# Patient Record
Sex: Male | Born: 1945 | Race: White | Hispanic: No
Health system: Midwestern US, Community
[De-identification: ages and names within clinical notes are randomized; demographics above are authoritative.]

## PROBLEM LIST (undated history)

## (undated) DIAGNOSIS — I10 Essential (primary) hypertension: Secondary | ICD-10-CM

## (undated) DIAGNOSIS — K859 Acute pancreatitis without necrosis or infection, unspecified: Secondary | ICD-10-CM

## (undated) DIAGNOSIS — I809 Phlebitis and thrombophlebitis of unspecified site: Secondary | ICD-10-CM

## (undated) DIAGNOSIS — G4700 Insomnia, unspecified: Secondary | ICD-10-CM

## (undated) HISTORY — PX: OTHER SURGICAL HISTORY: SHX169

## (undated) MED FILL — ELIQUIS 5MG TABS: 5 MG | 30 days supply | Qty: 60 | Fill #0 | Status: AC

## (undated) MED FILL — ELIQUIS 5MG TABS: 5 MG | 14 days supply | Qty: 28 | Fill #0 | Status: AC

---

## 2015-09-20 ENCOUNTER — Emergency Department (HOSPITAL_COMMUNITY)
Admission: EM | Admit: 2015-09-20 | Discharge: 2015-09-20 | Disposition: A | Discharge status: Home / Self Care | Payer: Medicare Other | Attending: Emergency Medicine | Admitting: Emergency Medicine

## 2015-09-20 ENCOUNTER — Emergency Department (HOSPITAL_COMMUNITY): Payer: Medicare Other

## 2015-09-20 ENCOUNTER — Encounter (HOSPITAL_COMMUNITY): Payer: Self-pay | Admitting: Emergency Medicine

## 2015-09-20 DIAGNOSIS — I10 Essential (primary) hypertension: Secondary | ICD-10-CM | POA: Insufficient documentation

## 2015-09-20 DIAGNOSIS — R109 Unspecified abdominal pain: Secondary | ICD-10-CM | POA: Diagnosis present

## 2015-09-20 DIAGNOSIS — G8929 Other chronic pain: Secondary | ICD-10-CM

## 2015-09-20 DIAGNOSIS — K5660 Unspecified intestinal obstruction: Secondary | ICD-10-CM | POA: Insufficient documentation

## 2015-09-20 DIAGNOSIS — R197 Diarrhea, unspecified: Secondary | ICD-10-CM | POA: Insufficient documentation

## 2015-09-20 DIAGNOSIS — K56609 Unspecified intestinal obstruction, unspecified as to partial versus complete obstruction: Secondary | ICD-10-CM

## 2015-09-20 HISTORY — DX: Acute pancreatitis without necrosis or infection, unspecified: K85.90

## 2015-09-20 HISTORY — DX: Essential (primary) hypertension: I10

## 2015-09-20 HISTORY — DX: Phlebitis and thrombophlebitis of unspecified site: I80.9

## 2015-09-20 HISTORY — DX: Insomnia, unspecified: G47.00

## 2015-09-20 LAB — COMPREHENSIVE METABOLIC PANEL
ALT: 19 U/L (ref 17–63)
AST: 25 U/L (ref 15–41)
Albumin: 3.8 g/dL (ref 3.5–5.0)
Alkaline Phosphatase: 45 U/L (ref 38–126)
Anion gap: 8 (ref 5–15)
BUN: 6 mg/dL (ref 6–20)
CALCIUM: 9.3 mg/dL (ref 8.9–10.3)
CHLORIDE: 104 mmol/L (ref 101–111)
CO2: 26 mmol/L (ref 22–32)
CREATININE: 0.96 mg/dL (ref 0.61–1.24)
Glucose, Bld: 149 mg/dL — ABNORMAL HIGH (ref 65–99)
Potassium: 4.2 mmol/L (ref 3.5–5.1)
Sodium: 138 mmol/L (ref 135–145)
TOTAL PROTEIN: 6.7 g/dL (ref 6.5–8.1)
Total Bilirubin: 0.7 mg/dL (ref 0.3–1.2)

## 2015-09-20 LAB — URINALYSIS, ROUTINE W REFLEX MICROSCOPIC
Bilirubin Urine: NEGATIVE
Glucose, UA: NEGATIVE mg/dL
Hgb urine dipstick: NEGATIVE
Ketones, ur: NEGATIVE mg/dL
LEUKOCYTES UA: NEGATIVE
NITRITE: NEGATIVE
PROTEIN: NEGATIVE mg/dL
Specific Gravity, Urine: 1.009 (ref 1.005–1.030)
pH: 6.5 (ref 5.0–8.0)

## 2015-09-20 LAB — CBC
HCT: 41.3 % (ref 39.0–52.0)
Hemoglobin: 13.8 g/dL (ref 13.0–17.0)
MCH: 29.2 pg (ref 26.0–34.0)
MCHC: 33.4 g/dL (ref 30.0–36.0)
MCV: 87.5 fL (ref 78.0–100.0)
PLATELETS: 157 10*3/uL (ref 150–400)
RBC: 4.72 MIL/uL (ref 4.22–5.81)
RDW: 14.5 % (ref 11.5–15.5)
WBC: 7.8 10*3/uL (ref 4.0–10.5)

## 2015-09-20 LAB — PROTIME-INR
INR: 2.93
PROTHROMBIN TIME: 31.2 s — AB (ref 11.4–15.2)

## 2015-09-20 LAB — LIPASE, BLOOD: LIPASE: 27 U/L (ref 11–51)

## 2015-09-20 MED ORDER — IOPAMIDOL (ISOVUE-300) INJECTION 61%
INTRAVENOUS | Status: AC
  Administered 2015-09-20: 100 mL
  Filled 2015-09-20: qty 100

## 2015-09-20 NOTE — ED Triage Notes (Signed)
Pt states "yesterday I had an xray done and it showed a blockage". Pt c/o diarrhea. Denies n/v. Denies any new pain. Last BM was today.

## 2015-09-20 NOTE — ED Provider Notes (Signed)
[facility]-EMERGENCY DEPT Provider Note   CSN: [id number] Arrival date & time: 09/20/15  1153     History   Chief Complaint Chief Complaint  Patient presents with  . Abdominal Pain    HPI Shawn Colon is a 70 y.o. male.  Patient is a 70 year old male with past medical history of chronic pancreatitis, phlebitis (currently on Coumadin) and hypertension who presents the ED with reported bout distraction. Patient reports he was seen by his primary care provider Shawn Colon(Mike Carroll, PA-C at [facility]) to follow-up regarding his recent change from next Coumadin last week and notes he had an x-ray performed. Patient states he was notified by the office this morning that his x-ray showed a bowel obstruction and was advised to come to the ED. Patient reports he has had nonbloody loose stool for the past week. He reports having chronic abdominal pain associated with his chronic pancreatitis but denies any recent changes. Denies fever, chills, cough, SOB, CP, N/V, urinary sxs, blood in urine or stool. Denies any recent use of antibiotics, recent hospitalist, recent travel outside the [place] or drinking from any fresh water sources. Patient reports he was feeling fine and states he would not come to the ED EMS he was advised to by his PCP. Denies history of abdominal surgeries.      Past Medical History:  Diagnosis Date  . Hypertension   . Insomnia   . Pancreatitis   . Phlebitis     There are no active problems to display for this patient.   Past Surgical History:  Procedure Laterality Date  . left heel surgery         Home Medications    Prior to Admission medications   Medication Sig Start Date End Date Taking? Authorizing Provider  clopidogrel (PLAVIX) 75 MG tablet Take 75 mg by mouth daily.   Yes Historical Provider, MD  DOXEPIN HCL PO Take by mouth.   Yes Historical Provider, MD  gabapentin (NEURONTIN) 300 MG capsule Take 300 mg by mouth 3 (three) times daily.   Yes Historical  Provider, MD  gabapentin (NEURONTIN) 600 MG tablet Take 600 mg by mouth 3 (three) times daily.   Yes Historical Provider, MD  levothyroxine (SYNTHROID, LEVOTHROID) 75 MCG tablet Take 75 mcg by mouth daily before breakfast.   Yes Historical Provider, MD  lisinopril (PRINIVIL,ZESTRIL) 10 MG tablet Take 10 mg by mouth daily.   Yes Historical Provider, MD  Zolpidem Tartrate (AMBIEN PO) Take by mouth.   Yes Historical Provider, MD    Family History No family history on file.  Social History Social History  Substance Use Topics  . Smoking status: Never Smoker  . Smokeless tobacco: Not on file  . Alcohol use No     Allergies   Flagyl [metronidazole]; Levaquin [levofloxacin in d5w]; Morphine and related; and Septra [sulfamethoxazole-trimethoprim]   Review of Systems Review of Systems  Gastrointestinal: Positive for abdominal pain (chronic, unchanged) and diarrhea (nonbloody, loose).  All other systems reviewed and are negative.    Physical Exam Updated Vital Signs BP 148/70   Pulse 73   Temp 97.5 F (36.4 C) (Oral)   Resp 13   SpO2 98%   Physical Exam  Constitutional: He is oriented to person, place, and time. He appears well-developed and well-nourished.  HENT:  Head: Normocephalic and atraumatic.  Mouth/Throat: Oropharynx is clear and moist. No oropharyngeal exudate.  Eyes: Conjunctivae and EOM are normal. Pupils are equal, round, and reactive to light. Right eye exhibits no  discharge. Left eye exhibits no discharge. No scleral icterus.  Neck: Normal range of motion. Neck supple.  Cardiovascular: Normal rate, regular rhythm, normal heart sounds and intact distal pulses.   Pulmonary/Chest: Effort normal and breath sounds normal. No respiratory distress. He has no wheezes. He has no rales. He exhibits no tenderness.  Abdominal: Soft. Bowel sounds are normal. He exhibits no distension and no mass. There is no tenderness. There is no rebound and no guarding. No hernia.    Musculoskeletal: Normal range of motion. He exhibits no edema.  Neurological: He is alert and oriented to person, place, and time.  Skin: Skin is warm and dry.  Nursing note and vitals reviewed.    ED Treatments / Results  Labs (all labs ordered are listed, but only abnormal results are displayed) Labs Reviewed  COMPREHENSIVE METABOLIC PANEL - Abnormal; Notable for the following:       Result Value   Glucose, Bld 149 (*)    All other components within normal limits  LIPASE, BLOOD  CBC  URINALYSIS, ROUTINE W REFLEX MICROSCOPIC (NOT AT [facility])  PROTIME-INR    EKG  EKG Interpretation None       Radiology No results found.  Procedures Procedures (including critical care time)  Medications Ordered in ED Medications - No data to display   Initial Impression / Assessment and Plan / ED Course  I have reviewed the triage vital signs and the nursing notes.  Pertinent labs & imaging results that were available during my care of the patient were reviewed by me and considered in my medical decision making (see chart for details).  Clinical Course    Pt presents for possible SBO. Pt was seen by PCP yesterday for follow up regarding recent change from plavix to coumadin and acute nonbloody diarrhea and was notified that his abdominal xray that was performed yesterday showing possible SBO. Hx of phlebitis, chronic pancreatitis. VSS. Exam unremarkable, no abdominal tenderness. INR 2.9. Labs unremarkable. CT abdomen ordered.  Hand-off to Dr. Cristi Loron. Pending CT abdomen. Dispo pending imaging.   Final Clinical Impressions(s) / ED Diagnoses   Final diagnoses:  Bowel obstruction [facility])    New Prescriptions New Prescriptions   No medications on file     Barrett Henle, [place] 09/20/15 1602    Charlynne Pander, MD 09/23/15 1139

## 2015-09-20 NOTE — ED Notes (Signed)
Pt attempted to urinate to collect sample. Attempt unsuccessful.

## 2017-12-20 ENCOUNTER — Emergency Department: Payer: Medicare Other

## 2017-12-20 ENCOUNTER — Emergency Department
Admission: EM | Admit: 2017-12-20 | Discharge: 2017-12-20 | Disposition: A | Discharge status: Home / Self Care | Payer: Medicare Other | Attending: Emergency Medicine | Admitting: Emergency Medicine

## 2017-12-20 ENCOUNTER — Other Ambulatory Visit: Payer: Self-pay

## 2017-12-20 ENCOUNTER — Encounter: Payer: Self-pay | Admitting: Emergency Medicine

## 2017-12-20 DIAGNOSIS — S298XXA Other specified injuries of thorax, initial encounter: Secondary | ICD-10-CM | POA: Diagnosis not present

## 2017-12-20 DIAGNOSIS — Y929 Unspecified place or not applicable: Secondary | ICD-10-CM | POA: Insufficient documentation

## 2017-12-20 DIAGNOSIS — Z79899 Other long term (current) drug therapy: Secondary | ICD-10-CM | POA: Diagnosis not present

## 2017-12-20 DIAGNOSIS — S4981XA Other specified injuries of right shoulder and upper arm, initial encounter: Secondary | ICD-10-CM | POA: Diagnosis present

## 2017-12-20 DIAGNOSIS — Y999 Unspecified external cause status: Secondary | ICD-10-CM | POA: Diagnosis not present

## 2017-12-20 DIAGNOSIS — Y9389 Activity, other specified: Secondary | ICD-10-CM | POA: Diagnosis not present

## 2017-12-20 DIAGNOSIS — I10 Essential (primary) hypertension: Secondary | ICD-10-CM | POA: Insufficient documentation

## 2017-12-20 DIAGNOSIS — M25511 Pain in right shoulder: Secondary | ICD-10-CM

## 2017-12-20 DIAGNOSIS — W1812XA Fall from or off toilet with subsequent striking against object, initial encounter: Secondary | ICD-10-CM | POA: Diagnosis not present

## 2017-12-20 DIAGNOSIS — W19XXXA Unspecified fall, initial encounter: Secondary | ICD-10-CM

## 2017-12-20 DIAGNOSIS — S299XXA Unspecified injury of thorax, initial encounter: Secondary | ICD-10-CM

## 2017-12-20 MED ORDER — KETOROLAC TROMETHAMINE 30 MG/ML IJ SOLN
15.0000 mg | Freq: Once | INTRAMUSCULAR | Status: AC
  Administered 2017-12-20: 15 mg via INTRAMUSCULAR
  Filled 2017-12-20: qty 1

## 2017-12-20 NOTE — ED Provider Notes (Signed)
[facility] Emergency Department Provider Note   ____________________________________________    I have reviewed the triage vital signs and the nursing notes.   HISTORY  Chief Complaint Shoulder Pain and Rib Injury     HPI Shawn Colon is a 72 y.o. male who presents after a fall.  He complains of right-sided shoulder pain as well as right-sided chest wall pain.  Patient reports he leaned forward too far while sitting on the toilet and fell forward into a wall.  Denies LOC.  No significant head injury.  No back pain or neck pain.  Complains of right-sided chest pain which is mild to moderate but only with movement.  Also moderate right shoulder pain although he is able to range it but it is uncomfortable.  Has taken an oxycodone which has helped.   Past Medical History:  Diagnosis Date  . Hypertension   . Insomnia   . Pancreatitis   . Phlebitis     There are no active problems to display for this patient.   Past Surgical History:  Procedure Laterality Date  . left heel surgery      Prior to Admission medications   Medication Sig Start Date End Date Taking? Authorizing Provider  DOXEPIN HCL PO Take by mouth.    [provider]  ferrous sulfate 325 (65 FE) MG tablet Take 325 mg by mouth daily with breakfast.    [provider]  gabapentin (NEURONTIN) 600 MG tablet Take 600 mg by mouth 3 (three) times daily.    [provider]  levothyroxine (SYNTHROID, LEVOTHROID) 75 MCG tablet Take 75 mcg by mouth daily before breakfast.    [provider]  lisinopril (PRINIVIL,ZESTRIL) 10 MG tablet Take 10 mg by mouth daily.    [provider]  oxyCODONE-acetaminophen (PERCOCET) 7.5-325 MG tablet Take 1 tablet by mouth every 4 (four) hours as needed for severe pain.    [provider]  Potassium 75 MG TABS Take 75 mg by mouth daily.    [provider]  QUEtiapine (SEROQUEL) 100 MG tablet Take 100  mg by mouth at bedtime.    [provider]  warfarin (COUMADIN) 4 MG tablet Take 4 mg by mouth daily.    [provider]  Zolpidem Tartrate (AMBIEN PO) Take 20 mg by mouth at bedtime.     [provider]     Allergies Flagyl [metronidazole]; Levaquin [levofloxacin in d5w]; Morphine and related; and Septra [sulfamethoxazole-trimethoprim]  No family history on file.  Social History Social History   Tobacco Use  . Smoking status: Never Smoker  Substance Use Topics  . Alcohol use: No  . Drug use: Not on file    Review of Systems  Constitutional: No dizziness Eyes: No visual changes.  ENT: No neck pain Cardiovascular: As above Respiratory: Denies shortness of breath. Gastrointestinal: No abdominal pain.  No nausea, no vomiting.   Genitourinary: Negative for groin injury Musculoskeletal: As above Skin: Negative for laceration Neurological: Negative for headaches or weakness   ____________________________________________   PHYSICAL EXAM:  VITAL SIGNS: ED Triage Vitals  Enc Vitals Group     BP 12/20/17 1330 (!) 122/52     Pulse Rate 12/20/17 1330 76     Resp 12/20/17 1330 16     Temp 12/20/17 1330 98.2 F (36.8 C)     Temp Source 12/20/17 1330 Oral     SpO2 12/20/17 1330 91 %     Weight 12/20/17 1331 83 kg (  183 lb)     Height 12/20/17 1331 1.803 m (5\' 11" )     Head Circumference --      Peak Flow --      Pain Score 12/20/17 1337 8     Pain Loc --      Pain Edu? --      Excl. in GC? --     Constitutional: Alert and oriented. No acute distress.  Eyes: Conjunctivae are normal.  Head: Atraumatic. Nose: No swelling or epistaxis. Mouth/Throat: Mucous membranes are moist.   Neck:  Painless ROM, no vertebral tenderness palpation Cardiovascular: Normal rate, regular rhythm. Grossly normal heart sounds.  Good peripheral circulation.  Mild tenderness palpation along the right anterior chest wall at approximately T4-T5 level Respiratory: Normal  respiratory effort.  No retractions. Lungs CTAB. Gastrointestinal: Soft and nontender. No distention.  No CVA tenderness.  Musculoskeletal: Right shoulder: No significant swelling or erythema, range of motion is uncomfortable but he is able to do it.  No bony ab normalities.  Clavicle is normal.  Warm and well perfused extremity Neurologic:  Normal speech and language. No gross focal neurologic deficits are appreciated.  Skin:  Skin is warm, dry and intact. No rash noted. Psychiatric: Mood and affect are normal. Speech and behavior are normal.  ____________________________________________   LABS (all labs ordered are listed, but only abnormal results are displayed)  Labs Reviewed - No data to display ____________________________________________  EKG   ____________________________________________  RADIOLOGY  Rib x-ray negative, right shoulder x-ray negative ____________________________________________   PROCEDURES  Procedure(s) performed: No  Procedures   Critical Care performed: No ____________________________________________   INITIAL IMPRESSION / ASSESSMENT AND PLAN / ED COURSE  Pertinent labs & imaging results that were available during my care of the patient were reviewed by me and considered in my medical decision making (see chart for details).  Patient presents after a fall with pain as described above, x-rays negative for bony injury.  Discussed with patient possible rib fracture and recommended CT without contrast however the patient declined.  Discussed with patient the risk for pneumonia. recommend supportive care, outpatient follow-up, return precautions discussed.     ____________________________________________   FINAL CLINICAL IMPRESSION(S) / ED DIAGNOSES  Final diagnoses:  Fall, initial encounter  Acute pain of right shoulder  Chest wall injury, initial encounter        Note:  This document was prepared using Dragon voice recognition  software and may include unintentional dictation errors.    Jene Every, MD 12/20/17 [id number]

## 2017-12-20 NOTE — ED Notes (Signed)
FIRST NURSE NOTE: pt comes into the ED WR via EMS from home, states he lost his balance yesterday and fell hitting his head, denies LOC. Pt is on coumadin. Pt c/o right shoulder pain and lower back pain. Pt is a/ox4 on arrival, able to stand and pivet. Wife is sitting with the pt in the WR.

## 2017-12-20 NOTE — ED Triage Notes (Signed)
States lost balance yesterday and fell. Pain R shoulder and R chest wall. No resp distress.

## 2018-01-18 ENCOUNTER — Encounter: Payer: Medicare Other | Attending: Physician Assistant | Admitting: Physician Assistant

## 2018-01-18 DIAGNOSIS — Z7901 Long term (current) use of anticoagulants: Secondary | ICD-10-CM | POA: Diagnosis not present

## 2018-01-18 DIAGNOSIS — E1151 Type 2 diabetes mellitus with diabetic peripheral angiopathy without gangrene: Secondary | ICD-10-CM | POA: Diagnosis not present

## 2018-01-18 DIAGNOSIS — Z881 Allergy status to other antibiotic agents status: Secondary | ICD-10-CM | POA: Insufficient documentation

## 2018-01-18 DIAGNOSIS — Z888 Allergy status to other drugs, medicaments and biological substances status: Secondary | ICD-10-CM | POA: Diagnosis not present

## 2018-01-18 DIAGNOSIS — Z87891 Personal history of nicotine dependence: Secondary | ICD-10-CM | POA: Diagnosis not present

## 2018-01-18 DIAGNOSIS — E1140 Type 2 diabetes mellitus with diabetic neuropathy, unspecified: Secondary | ICD-10-CM | POA: Insufficient documentation

## 2018-01-18 DIAGNOSIS — Z882 Allergy status to sulfonamides status: Secondary | ICD-10-CM | POA: Insufficient documentation

## 2018-01-18 DIAGNOSIS — Z885 Allergy status to narcotic agent status: Secondary | ICD-10-CM | POA: Insufficient documentation

## 2018-01-18 DIAGNOSIS — G9009 Other idiopathic peripheral autonomic neuropathy: Secondary | ICD-10-CM | POA: Insufficient documentation

## 2018-01-18 DIAGNOSIS — Z809 Family history of malignant neoplasm, unspecified: Secondary | ICD-10-CM | POA: Insufficient documentation

## 2018-01-18 DIAGNOSIS — L89613 Pressure ulcer of right heel, stage 3: Secondary | ICD-10-CM | POA: Insufficient documentation

## 2018-01-18 DIAGNOSIS — E11622 Type 2 diabetes mellitus with other skin ulcer: Secondary | ICD-10-CM | POA: Diagnosis not present

## 2018-01-18 DIAGNOSIS — I10 Essential (primary) hypertension: Secondary | ICD-10-CM | POA: Diagnosis not present

## 2018-01-21 NOTE — Progress Notes (Signed)
Shawn, Colon ([id number]) Visit Report for 01/18/2018 Abuse/Suicide Risk Screen Details Patient Name: Shawn Colon, Shawn Colon Date of Service: 01/18/2018 1:15 PM Medical Record Number: [id number] Patient Account Number: [id number] Date of Birth/Sex: June 22, 1945 (73 y.o. Male) Treating RN: Arnette Norris Primary Care Gryphon Vanderveen: Wynn Banker Other Clinician: Referring Miraya Cudney: Wynn Banker Treating Mickeal Daws/Extender: STONE III, HOYT Weeks in Treatment: 0 Abuse/Suicide Risk Screen Items Answer ABUSE/SUICIDE RISK SCREEN: Has anyone close to you tried to hurt or harm you recentlyo No Do you feel uncomfortable with anyone in your familyo No Has anyone forced you do things that you didnot want to doo No Do you have any thoughts of harming yourselfo No Patient displays signs or symptoms of abuse and/or neglect. No Electronic Signature(s) Signed: 01/20/2018 2:41:13 PM By: Arnette Norris Entered By: Arnette Norris on 01/18/2018 13:55:38 Shawn Colon ([id number]) -------------------------------------------------------------------------------- Activities of Daily Living Details Patient Name: Shawn, Colon Date of Service: 01/18/2018 1:15 PM Medical Record Number: [id number] Patient Account Number: [id number] Date of Birth/Sex: 1946/01/03 (73 y.o. Male) Treating RN: Arnette Norris Primary Care Neils Sirac[place]: Wynn Banker Other Clinician: Referring Khadeja Abt: Wynn Banker Treating Wilbur [place]/Extender: STONE III, HOYT Weeks in Treatment: 0 Activities of Daily Living Items Answer Activities of Daily Living (Please select one for each item) Drive Automobile Not Able Take Medications Completely Able Use Telephone Completely Able Care for Appearance Completely Able Use Toilet Completely Able Bath / Shower Completely Able Dress Self Completely Able Feed Self Completely Able Walk Completely Able Get In / Out Bed Completely Able Housework Completely Able Prepare Meals Completely Able Handle Money  Completely Able Shop for Self Completely Able Electronic Signature(s) Signed: 01/20/2018 2:41:13 PM By: Arnette Norris Entered By: Arnette Norris on 01/18/2018 13:55:59 Shawn Colon ([id number]) -------------------------------------------------------------------------------- Education Assessment Details Patient Name: Shawn Colon Date of Service: 01/18/2018 1:15 PM Medical Record Number: [id number] Patient Account Number: [id number] Date of Birth/Sex: 1945/07/10 (73 y.o. Male) Treating RN: Arnette Norris Primary Care Toniya Rozar: Wynn Banker Other Clinician: Referring Mohammed Mcandrew: Wynn Banker Treating Camdyn Beske/Extender: Linwood Dibbles, HOYT Weeks in Treatment: 0 Primary Learner Assessed: Patient Learning Preferences/Education Level/Primary Language Learning Preference: Explanation Highest Education Level: College or Above Preferred Language: English Cognitive Barrier Assessment/Beliefs Language Barrier: No Translator Needed: No Memory Deficit: No Emotional Barrier: No Physical Barrier Assessment Impaired Vision: No Impaired Hearing: No Decreased Hand dexterity: No Knowledge/Comprehension Assessment Knowledge Level: High Comprehension Level: High Ability to understand written High instructions: Ability to understand verbal High instructions: Motivation Assessment Anxiety Level: Calm Cooperation: Cooperative Education Importance: Acknowledges Need Interest in Health Problems: Asks Questions Perception: Coherent Willingness to Engage in Self- High Management Activities: Readiness to Engage in Self- High Management Activities: Electronic Signature(s) Signed: 01/20/2018 2:41:13 PM By: Arnette Norris Entered By: Arnette Norris on 01/18/2018 13:56:35 Shawn Colon ([id number]) -------------------------------------------------------------------------------- Fall Risk Assessment Details Patient Name: Shawn Colon Date of Service: 01/18/2018 1:15 PM Medical Record  Number: [id number] Patient Account Number: [id number] Date of Birth/Sex: [birth year] (73 y.o. Male) Treating RN: Arnette Norris Primary Care Amiah Frohlich: Wynn Banker Other Clinician: Referring Beola Vasallo: Wynn Banker Treating Renan Danese/Extender: STONE III, HOYT Weeks in Treatment: 0 Fall Risk Assessment Items Have you had 2 or more falls in the last 12 monthso 0 No Have you had any fall that resulted in injury in the last 12 monthso 0 No FALL RISK ASSESSMENT: History of falling - immediate or within 3 months 25 Yes Secondary diagnosis 0 No Ambulatory aid None/bed rest/wheelchair/nurse 0 No Crutches/cane/walker 0 No Furniture 0 No IV Access/Saline Lock 0 No Gait/Training Normal/bed rest/immobile 0 No Weak 0 No  Impaired 0 No Mental Status Oriented to own ability 0 Yes Electronic Signature(s) Signed: 01/20/2018 2:41:13 PM By: Arnette Norris Entered By: Arnette Norris on 01/18/2018 13:57:37 Shawn Colon ([id number]) -------------------------------------------------------------------------------- Foot Assessment Details Patient Name: Shawn Colon Date of Service: 01/18/2018 1:15 PM Medical Record Number: [id number] Patient Account Number: [id number] Date of Birth/Sex: [birth year] (73 y.o. Male) Treating RN: Arnette Norris Primary Care Trevontae Lindahl: Wynn Banker Other Clinician: Referring Hamzah Savoca: Wynn Banker Treating Natalye Kott/Extender: STONE III, HOYT Weeks in Treatment: 0 Foot Assessment Items Site Locations + = Sensation present, - = Sensation absent, C = Callus, U = Ulcer R = Redness, W = Warmth, M = Maceration, PU = Pre-ulcerative lesion F = Fissure, S = Swelling, D = Dryness Assessment Right: Left: Other Deformity: No No Prior Foot Ulcer: No No Prior Amputation: No No Charcot Joint: No No Ambulatory Status: Ambulatory Without Help Gait: Unsteady Electronic Signature(s) Signed: 01/20/2018 2:41:13 PM By: Arnette Norris Entered By: Arnette Norris on 01/18/2018  13:59:58 Shawn Colon ([id number]) -------------------------------------------------------------------------------- Nutrition Risk Assessment Details Patient Name: Shawn Colon Date of Service: 01/18/2018 1:15 PM Medical Record Number: [id number] Patient Account Number: [id number] Date of Birth/Sex: [birth year] (73 y.o. Male) Treating RN: Arnette Norris Primary Care Clela Hagadorn: Wynn Banker Other Clinician: Referring Boots Mcglown: Wynn Banker Treating Javier Mamone/Extender: STONE III, HOYT Weeks in Treatment: 0 Height (in): 70 Weight (lbs): 156 Body Mass Index (BMI): 22.4 Nutrition Risk Assessment Items NUTRITION RISK SCREEN: I have an illness or condition that made me change the kind and/or amount of 0 No food I eat I eat fewer than two meals per day 0 No I eat few fruits and vegetables, or milk products 0 No I have three or more drinks of beer, liquor or wine almost every day 0 No I have tooth or mouth problems that make it hard for me to eat 0 No I don't always have enough money to buy the food I need 0 No I eat alone most of the time 0 No I take three or more different prescribed or over-the-counter drugs a day 1 Yes Without wanting to, I have lost or gained 10 pounds in the last six months 0 No I am not always physically able to shop, cook and/or feed myself 0 No Nutrition Protocols Good Risk Protocol Moderate Risk Protocol Electronic Signature(s) Signed: 01/20/2018 2:41:13 PM By: Arnette Norris Entered By: Arnette Norris on 01/18/2018 13:58:02

## 2018-01-21 NOTE — Progress Notes (Signed)
PLACIDO, HANGARTNER ([id number]) Visit Report for 01/18/2018 Allergy List Details Patient Name: Shawn Colon, Shawn Colon Date of Service: 01/18/2018 1:15 PM Medical Record Number: [id number] Patient Account Number: [id number] Date of Birth/Sex: 03-Apr-1945 (73 y.o. Male) Treating RN: Arnette Norris Primary Care Seng Fouts: Wynn Banker Other Clinician: Referring Channon Brougher: Wynn Banker Treating Kleber Crean/Extender: STONE III, HOYT Weeks in Treatment: 0 Allergies Active Allergies Septra Reaction: don't remember Sulfa (Sulfonamide Antibiotics) Reaction: flu symptoms Flagyl Levaquin morphine Allergy Notes Electronic Signature(s) Signed: 01/18/2018 2:36:03 PM By: Curtis Sites Entered By: Curtis Sites on 01/18/2018 14:36:03 [name] ([id number]) -------------------------------------------------------------------------------- Arrival Information Details Patient Name: [name] Date of Service: 01/18/2018 1:15 PM Medical Record Number: [id number] Patient Account Number: [id number] Date of Birth/Sex: May 31, 1945 (73 y.o. Male) Treating RN: Curtis Sites Primary Care Natoria Archibald: Wynn Banker Other Clinician: Referring Ariz Terrones: Wynn Banker Treating Elfego Giammarino/Extender: STONE III, HOYT Weeks in Treatment: 0 Visit Information Patient Arrived: Wheel Chair Arrival Time: 13:20 Accompanied By: wife Transfer Assistance: None Patient Identification Verified: Yes Secondary Verification Process Yes Completed: Patient Has Alerts: Yes Patient Alerts: Patient on Blood Thinner Warfarin Electronic Signature(s) Signed: 01/18/2018 4:56:07 PM By: Curtis Sites Entered By: Curtis Sites on 01/18/2018 14:48:05 [name] ([id number]) -------------------------------------------------------------------------------- Clinic Level of Care Assessment Details Patient Name: [name] Date of Service: 01/18/2018 1:15 PM Medical Record Number: [id number] Patient Account Number: [id number] Date of  Birth/Sex: 12-25-1945 (73 y.o. Male) Treating RN: Curtis Sites Primary Care Daijha Leggio: Wynn Banker Other Clinician: Referring Bergen Magner: Wynn Banker Treating Elihu Milstein/Extender: STONE III, HOYT Weeks in Treatment: 0 Clinic Level of Care Assessment Items TOOL 1 Quantity Score []  - Use when EandM and Procedure is performed on INITIAL visit 0 ASSESSMENTS - Nursing Assessment / Reassessment X - General Physical Exam (combine w/ comprehensive assessment (listed just below) when 1 20 performed on new pt. evals) X- 1 25 Comprehensive Assessment (HX, ROS, Risk Assessments, Wounds Hx, etc.) ASSESSMENTS - Wound and Skin Assessment / Reassessment []  - Dermatologic / Skin Assessment (not related to wound area) 0 ASSESSMENTS - Ostomy and/or Continence Assessment and Care []  - Incontinence Assessment and Management 0 []  - 0 Ostomy Care Assessment and Management (repouching, etc.) PROCESS - Coordination of Care X - Simple Patient / Family Education for ongoing care 1 15 []  - 0 Complex (extensive) Patient / Family Education for ongoing care X- 1 10 Staff obtains Chiropractor, Records, Test Results / Process Orders []  - 0 Staff telephones HHA, Nursing Homes / Clarify orders / etc []  - 0 Routine Transfer to another Facility (non-emergent condition) []  - 0 Routine Hospital Admission (non-emergent condition) X- 1 15 New Admissions / Manufacturing engineer / Ordering NPWT, Apligraf, etc. []  - 0 Emergency Hospital Admission (emergent condition) PROCESS - Special Needs []  - Pediatric / Minor Patient Management 0 []  - 0 Isolation Patient Management []  - 0 Hearing / Language / Visual special needs []  - 0 Assessment of Community assistance (transportation, D/C planning, etc.) []  - 0 Additional assistance / Altered mentation []  - 0 Support Surface(s) Assessment (bed, cushion, seat, etc.) TYRUS, WILMS ([id number]) INTERVENTIONS - Miscellaneous []  - External ear exam 0 []  - 0 Patient  Transfer (multiple staff / Nurse, adult / Similar devices) []  - 0 Simple Staple / Suture removal (25 or less) []  - 0 Complex Staple / Suture removal (26 or more) []  - 0 Hypo/Hyperglycemic Management (do not check if billed separately) X- 1 15 Ankle / Brachial Index (ABI) - do not check if billed separately Has the patient been seen at the hospital within the  last three years: Yes Total Score: 100 Level Of Care: New/Established - Level 3 Electronic Signature(s) Signed: 01/18/2018 4:56:07 PM By: Curtis Sitesorthy, Joanna Entered By: Curtis Sitesorthy, Joanna on 01/18/2018 14:59:37 Sofie HartiganARPENTER, Orbie ([id number]) -------------------------------------------------------------------------------- Encounter Discharge Information Details Patient Name: Sofie HartiganARPENTER, Rochelle Date of Service: 01/18/2018 1:15 PM Medical Record Number: [id number] Patient Account Number: [id number] Date of Birth/Sex: 1945-06-20 [age] y.o. Male) Treating RN: Curtis Sitesorthy, Joanna Primary Care Sorrel Cassetta: Wynn BankerAVIS, DEBRA Other Clinician: Referring Azaya Goedde: Wynn BankerAVIS, DEBRA Treating Niles Ess/Extender: Linwood DibblesSTONE III, HOYT Weeks in Treatment: 0 Encounter Discharge Information Items Discharge Condition: Stable Ambulatory Status: Wheelchair Discharge Destination: Home Transportation: Private Auto Accompanied By: spouse Schedule Follow-up Appointment: Yes Clinical Summary of Care: Electronic Signature(s) Signed: 01/18/2018 4:56:07 PM By: Curtis Sitesorthy, Joanna Entered By: Curtis Sitesorthy, Joanna on 01/18/2018 14:53:17 Sofie HartiganARPENTER, Coron ([id number]) -------------------------------------------------------------------------------- Lower Extremity Assessment Details Patient Name: Sofie HartiganARPENTER, Colbey Date of Service: 01/18/2018 1:15 PM Medical Record Number: [id number] Patient Account Number: [id number] Date of Birth/Sex: 1945-06-20 [age] y.o. Male) Treating RN: Arnette NorrisBiell, Kristina Primary Care Consuelo Suthers: Wynn BankerAVIS, DEBRA Other Clinician: Referring Tafari Humiston: Wynn BankerAVIS, DEBRA Treating Adleigh Mcmasters/Extender: STONE III,  HOYT Weeks in Treatment: 0 Edema Assessment Assessed: [Left: No] [Right: No] Edema: [Left: Yes] [Right: Yes] Calf Left: Right: Point of Measurement: 32 cm From Medial Instep 32.5 cm 32 cm Ankle Left: Right: Point of Measurement: 11 cm From Medial Instep 22.5 cm 22.4 cm Vascular Assessment Pulses: Dorsalis Pedis Palpable: [Left:Yes] [Right:No] Posterior Tibial Palpable: [Left:Yes] [Right:No] Extremity colors, hair growth, and conditions: Extremity Color: [Left:Pale] [Right:Pale] Hair Growth on Extremity: [Left:Yes] [Right:Yes] Toe Nail Assessment Left: Right: Thick: Yes Yes Discolored: Yes Yes Deformed: Yes Yes Improper Length and Hygiene: No No Electronic Signature(s) Signed: 01/20/2018 2:41:13 PM By: Arnette NorrisBiell, Kristina Entered By: Arnette NorrisBiell, Kristina on 01/18/2018 14:07:56 Sofie HartiganARPENTER, Savaughn ([id number]) -------------------------------------------------------------------------------- Multi Wound Chart Details Patient Name: Sofie HartiganARPENTER, Lelend Date of Service: 01/18/2018 1:15 PM Medical Record Number: [id number] Patient Account Number: [id number] Date of Birth/Sex: 1945-06-20 [age] y.o. Male) Treating RN: Curtis Sitesorthy, Joanna Primary Care Kaylee Wombles: Wynn BankerAVIS, DEBRA Other Clinician: Referring Abrahm Mancia: Wynn BankerAVIS, DEBRA Treating Danile Trier/Extender: STONE III, HOYT Weeks in Treatment: 0 Vital Signs Height(in): 70 Pulse(bpm): 63 Weight(lbs): 156 Blood Pressure(mmHg): 141/41 Body Mass Index(BMI): 22 Temperature(F): 97.9 Respiratory Rate 16 (breaths/min): Photos: [1:No Photos] [N/A:N/A] Wound Location: [1:Right Calcaneus - Medial] [N/A:N/A] Wounding Event: [1:Shear/Friction] [N/A:N/A] Primary Etiology: [1:Trauma, Other] [N/A:N/A] Comorbid History: [1:Hypertension, Phlebitis, Osteomyelitis] [N/A:N/A] Date Acquired: [1:12/18/2017] [N/A:N/A] Weeks of Treatment: [1:0] [N/A:N/A] Wound Status: [1:Open] [N/A:N/A] Measurements L x W x D [1:2.7x2.2x0.1] [N/A:N/A] (cm) Area (cm) : [1:4.665]  [N/A:N/A] Volume (cm) : [1:0.467] [N/A:N/A] Classification: [1:Partial Thickness] [N/A:N/A] Exudate Amount: [1:None Present] [N/A:N/A] Wound Margin: [1:Thickened] [N/A:N/A] Granulation Amount: [1:None Present (0%)] [N/A:N/A] Necrotic Amount: [1:Large (67-100%)] [N/A:N/A] Necrotic Tissue: [1:Eschar, Adherent Slough] [N/A:N/A] Exposed Structures: [1:Fascia: No Fat Layer (Subcutaneous Tissue) Exposed: No Tendon: No Muscle: No Joint: No Bone: No] [N/A:N/A] Epithelialization: [1:None] [N/A:N/A] Periwound Skin Texture: [1:No Abnormalities Noted] [N/A:N/A] Periwound Skin Moisture: [1:Dry/Scaly: Yes] [N/A:N/A] Periwound Skin Color: [1:Erythema: Yes] [N/A:N/A] Erythema Location: [1:Circumferential] [N/A:N/A] Tenderness on Palpation: [1:No] [N/A:N/A] Wound Preparation: [1:Ulcer Cleansing: Rinsed/Irrigated with Saline] [N/A:N/A] Topical Anesthetic Applied: Other: lidocaine 4% Sofie HartiganCARPENTER, Mukhtar ([id number]) Treatment Notes Electronic Signature(s) Signed: 01/18/2018 4:56:07 PM By: Curtis Sitesorthy, Joanna Entered By: Curtis Sitesorthy, Joanna on 01/18/2018 14:45:35 Sofie HartiganCARPENTER, Huriel ([id number]) -------------------------------------------------------------------------------- Multi-Disciplinary Care Plan Details Patient Name: Sofie HartiganARPENTER, Devontre Date of Service: 01/18/2018 1:15 PM Medical Record Number: [id number] Patient Account Number: [id number] Date of Birth/Sex: 1945-06-20 [age] y.o. Male) Treating RN: Curtis Sitesorthy, Joanna Primary Care Fenix Rorke: Wynn BankerAVIS, DEBRA Other Clinician: Referring Tagen Brethauer: Wynn BankerAVIS, DEBRA Treating Joon Pohle/Extender: Linwood DibblesSTONE III, HOYT  Weeks in Treatment: 0 Active Inactive Abuse / Safety / Falls / Self Care Management Nursing Diagnoses: Potential for falls Goals: Patient will not experience any injury related to falls Date Initiated: 01/18/2018 Target Resolution Date: 04/16/2018 Goal Status: Active Interventions: Assess fall risk on admission and as needed Notes: Orientation to the Wound Care  Program Nursing Diagnoses: Knowledge deficit related to the wound healing center program Goals: Patient/caregiver will verbalize understanding of the Wound Healing Center Program Date Initiated: 01/18/2018 Target Resolution Date: 04/16/2018 Goal Status: Active Interventions: Provide education on orientation to the wound center Notes: Wound/Skin Impairment Nursing Diagnoses: Impaired tissue integrity Goals: Ulcer/skin breakdown will heal within 14 weeks Date Initiated: 01/18/2018 Target Resolution Date: 04/16/2018 Goal Status: Active Interventions: Assess patient/caregiver ability to obtain necessary supplies BRNDON, MIYA ([id number]) Assess patient/caregiver ability to perform ulcer/skin care regimen upon admission and as needed Assess ulceration(s) every visit Notes: Electronic Signature(s) Signed: 01/18/2018 4:56:07 PM By: Curtis Sites Entered By: Curtis Sites on 01/18/2018 14:45:21 [name] ([id number]) -------------------------------------------------------------------------------- Pain Assessment Details Patient Name: [name] Date of Service: 01/18/2018 1:15 PM Medical Record Number: [id number] Patient Account Number: [id number] Date of Birth/Sex: 1946-01-10 (73 y.o. Male) Treating RN: Curtis Sites Primary Care Amorita Vanrossum: Wynn Banker Other Clinician: Referring Floyd Wade: Wynn Banker Treating Antwane Grose/Extender: STONE III, HOYT Weeks in Treatment: 0 Active Problems Location of Pain Severity and Description of Pain Patient Has Paino No Site Locations Pain Management and Medication Current Pain Management: Electronic Signature(s) Signed: 01/18/2018 3:06:21 PM By: Dayton Martes RCP, RRT, CHT Signed: 01/18/2018 4:56:07 PM By: Curtis Sites Entered By: Dayton Martes on 01/18/2018 13:21:09 [name] ([id number]) -------------------------------------------------------------------------------- Patient/Caregiver Education  Details Patient Name: [name] Date of Service: 01/18/2018 1:15 PM Medical Record Number: [id number] Patient Account Number: [id number] Date of Birth/Gender: [birth year] (73 y.o. Male) Treating RN: Curtis Sites Primary Care Physician: Wynn Banker Other Clinician: Referring Physician: Wynn Banker Treating Physician/Extender: Skeet Simmer in Treatment: 0 Education Assessment Education Provided To: Patient and Caregiver Education Topics Provided Wound/Skin Impairment: Handouts: Other: wound care as ordered Methods: Demonstration, Explain/Verbal Responses: State content correctly Electronic Signature(s) Signed: 01/18/2018 4:56:07 PM By: Curtis Sites Entered By: Curtis Sites on 01/18/2018 14:53:40 [name] ([id number]) -------------------------------------------------------------------------------- Wound Assessment Details Patient Name: [name] Date of Service: 01/18/2018 1:15 PM Medical Record Number: [id number] Patient Account Number: [id number] Date of Birth/Sex: May 05, 1945 (73 y.o. Male) Treating RN: Arnette Norris Primary Care Paradise Vensel: Wynn Banker Other Clinician: Referring Vinnie Gombert: Wynn Banker Treating Shamiyah Ngu/Extender: STONE III, HOYT Weeks in Treatment: 0 Wound Status Wound Number: 1 Primary Pressure Ulcer Etiology: Wound Location: Right Calcaneus - Medial Wound Status: Open Wounding Event: Shear/Friction Comorbid Hypertension, Phlebitis, Osteomyelitis, Date Acquired: 12/18/2017 History: Neuropathy Weeks Of Treatment: 0 Clustered Wound: No Photos Photo Uploaded By: Arnette Norris on 01/18/2018 16:16:51 Wound Measurements Length: (cm) 2.7 Width: (cm) 2.2 Depth: (cm) 0.1 Area: (cm) 4.665 Volume: (cm) 0.467 % Reduction in Area: 0% % Reduction in Volume: 0% Epithelialization: None Tunneling: No Undermining: No Wound Description Classification: Category/Stage III Wound Margin: Thickened Exudate Amount: None  Present Foul Odor After Cleansing: No Slough/Fibrino Yes Wound Bed Granulation Amount: None Present (0%) Exposed Structure Necrotic Amount: Large (67-100%) Fascia Exposed: No Necrotic Quality: Eschar, Adherent Slough Fat Layer (Subcutaneous Tissue) Exposed: Yes Tendon Exposed: No Muscle Exposed: No Joint Exposed: No Bone Exposed: No Periwound Skin Texture Texture Color No Abnormalities Noted: No No Abnormalities Noted: No Winquist, Aimee ([id number]) Moisture Erythema: Yes No Abnormalities Noted: No Erythema Location: Circumferential Dry / Scaly: Yes Temperature /  Pain Temperature: No Abnormality Tenderness on Palpation: Yes Wound Preparation Ulcer Cleansing: Rinsed/Irrigated with Saline Topical Anesthetic Applied: Other: lidocaine 4%, Treatment Notes Wound #1 (Right, Medial Calcaneus) Notes hydrogel, prisma, bordered foam dressing and secured with netting Electronic Signature(s) Signed: 01/18/2018 4:56:07 PM By: Curtis Sitesorthy, Joanna Signed: 01/20/2018 2:41:13 PM By: Arnette NorrisBiell, Kristina Entered By: Curtis Sitesorthy, Joanna on 01/18/2018 14:59:15 Sofie HartiganARPENTER, Damen ([id number]) -------------------------------------------------------------------------------- Vitals Details Patient Name: Sofie HartiganARPENTER, Blythe Date of Service: 01/18/2018 1:15 PM Medical Record Number: [id number] Patient Account Number: [id number] Date of Birth/Sex: 02/16/1945 [age] y.o. Male) Treating RN: Curtis Sitesorthy, Joanna Primary Care Itati Brocksmith: Wynn BankerAVIS, DEBRA Other Clinician: Referring Winda Summerall: Wynn BankerAVIS, DEBRA Treating Ashley Bultema/Extender: STONE III, HOYT Weeks in Treatment: 0 Vital Signs Time Taken: 13:21 Temperature (F): 97.9 Height (in): 70 Pulse (bpm): 63 Source: Stated Respiratory Rate (breaths/min): 16 Weight (lbs): 156 Blood Pressure (mmHg): 141/41 Source: Stated Reference Range: 80 - 120 mg / dl Body Mass Index (BMI): 22.4 Electronic Signature(s) Signed: 01/18/2018 3:06:21 PM By: Dayton MartesWallace, RCP,RRT,CHT, Sallie RCP, RRT, CHT Entered  By: Dayton MartesWallace, RCP,RRT,CHT, Sallie on 01/18/2018 13:24:41

## 2018-01-21 NOTE — Progress Notes (Signed)
Shawn Colon, Shawn Colon ([id number]) Visit Report for 01/18/2018 Chief Complaint Document Details Patient Name: Shawn Colon, Shawn Colon Date of Service: 01/18/2018 1:15 PM Medical Record Number: [id number] Patient Account Number: [id number] Date of Birth/Sex: [birth year] [age] y.o. Male) Treating RN: Curtis Sitesorthy, Joanna Primary Care Provider: Wynn BankerAVIS, DEBRA Other Clinician: Referring Provider: Wynn BankerAVIS, DEBRA Treating Provider/Extender: Linwood DibblesSTONE III, Daniel Ritthaler Weeks in Treatment: 0 Information Obtained from: Patient Chief Complaint Right heel ulcer Electronic Signature(s) Signed: 01/19/2018 8:35:34 AM By: Lenda KelpStone III, Natalio Salois PA-C Entered By: Lenda KelpStone III, Aleeha Boline on 01/18/2018 15:01:56 Shawn Colon, [name] ([id number]) -------------------------------------------------------------------------------- Debridement Details Patient Name: Shawn Colon, Shawn Colon Date of Service: 01/18/2018 1:15 PM Medical Record Number: [id number] Patient Account Number: [id number] Date of Birth/Sex: [birth year] [age] y.o. Male) Treating RN: Curtis Sitesorthy, Joanna Primary Care Provider: Wynn BankerAVIS, DEBRA Other Clinician: Referring Provider: Wynn BankerAVIS, DEBRA Treating Provider/Extender: STONE III, Niel Peretti Weeks in Treatment: 0 Debridement Performed for Wound #1 Right,Medial Calcaneus Assessment: Performed By: Physician STONE III, Brittane Grudzinski E., PA-C Debridement Type: Debridement Level of Consciousness (Pre- Awake and Alert procedure): Pre-procedure Verification/Time Yes - 14:44 Out Taken: Start Time: 14:44 Pain Control: Lidocaine 4% Topical Solution Total Area Debrided (L x W): 2.7 (cm) x 2.2 (cm) = 5.94 (cm) Tissue and other material Viable, Non-Viable, Eschar, Subcutaneous, Skin: Dermis , Skin: Epidermis debrided: Level: Skin/Subcutaneous Tissue Debridement Description: Excisional Instrument: Forceps, Scissors Bleeding: Minimum Hemostasis Achieved: Pressure End Time: 14:55 Procedural Pain: 0 Post Procedural Pain: 0 Response to Treatment: Procedure was tolerated well Level of  Consciousness Awake and Alert (Post-procedure): Post Debridement Measurements of Total Wound Length: (cm) 2.9 Width: (cm) 3.2 Depth: (cm) 0.1 Volume: (cm) 0.729 Character of Wound/Ulcer Post Debridement: Improved Post Procedure Diagnosis Same as Pre-procedure Electronic Signature(s) Signed: 01/18/2018 4:56:07 PM By: Curtis Sitesorthy, Joanna Signed: 01/19/2018 8:35:34 AM By: Lenda KelpStone III, Sadiq Mccauley PA-C Entered By: Curtis Sitesorthy, Joanna on 01/18/2018 14:57:03 Shawn Colon, Shawn Colon ([id number]) -------------------------------------------------------------------------------- HPI Details Patient Name: Shawn Colon, Shawn Colon Date of Service: 01/18/2018 1:15 PM Medical Record Number: [id number] Patient Account Number: [id number] Date of Birth/Sex: [birth year] [age] y.o. Male) Treating RN: Curtis Sitesorthy, Joanna Primary Care Provider: Wynn BankerAVIS, DEBRA Other Clinician: Referring Provider: Wynn BankerAVIS, DEBRA Treating Provider/Extender: STONE III, Delshon Blanchfield Weeks in Treatment: 0 History of Present Illness HPI Description: 01/18/18 on evaluation today patient presents for evaluation concerning issues that have been present for roughly 3 weeks after the patient sustained an injury to his shoulder due to a fall. He was in the bed for about three weeks. Of time and states that during that time this ulcer appeared. It sounds like it may have been a deep tissue injury initially and the appearance seems to lend itself to that as well. Fortunately there is no signs of infection at this time. No fevers, chills, nausea, or vomiting noted at this time. He does not have a history of diabetes although he states that he has been told it is "borderline". He does have a history of hypertension and is on blood thinners. With that being said I see no evidence of this point of any worsening in fact this seems to be healing. His ABI's were noncompressible but again he seems to have a warm foot with good capillary refill and again the wound seems to be healing well already I'm gonna  hold off on ordering arterial studies at this point. Electronic Signature(s) Signed: 01/19/2018 8:35:34 AM By: Lenda KelpStone III, Ikey Omary PA-C Entered By: Lenda KelpStone III, Nalla Purdy on 01/18/2018 15:03:22 Shawn Colon, Shawn Colon ([id number]) -------------------------------------------------------------------------------- Physical Exam Details Patient Name: Shawn Colon, Shawn Colon Date of Service: 01/18/2018 1:15 PM Medical Record Number: [id number] Patient Account Number: [id number] Date of  Birth/Sex: 03-09-1945 (73 y.o. Male) Treating RN: Curtis Sites Primary Care Provider: Wynn Banker Other Clinician: Referring Provider: Wynn Banker Treating Provider/Extender: STONE III, Felicha Frayne Weeks in Treatment: 0 Constitutional patient is hypertensive.. pulse regular and within target range for patient.Marland Kitchen respirations regular, non-labored and within target range for patient.Marland Kitchen temperature within target range for patient.. Well-nourished and well-hydrated in no acute distress. Eyes conjunctiva clear no eyelid edema noted. pupils equal round and reactive to light and accommodation. Ears, Nose, Mouth, and Throat no gross abnormality of ear auricles or external auditory canals. normal hearing noted during conversation. mucus membranes moist. Respiratory normal breathing without difficulty. clear to auscultation bilaterally. Cardiovascular regular rate and rhythm with normal S1, S2. 1+ dorsalis pedis/posterior tibialis pulses. no clubbing, cyanosis, significant edema, <3 sec cap refill. Gastrointestinal (GI) soft, non-tender, non-distended, +BS. no ventral hernia noted. Musculoskeletal normal gait and posture. no significant deformity or arthritic changes, no loss or range of motion, no clubbing. Psychiatric this patient is able to make decisions and demonstrates good insight into disease process. Alert and Oriented x 3. pleasant and cooperative. Notes Patient's wound bed actually did have necrotic tissue noted around the edge of the  wound a lot of this was eschar and when we moved it show complete epithelialization underneath. Patient's blood pressure was somewhat elevated today. No fevers, chills, nausea, or vomiting noted at this time. Fortunately his shoulder is feeling better. Post debridement the wound bed appears to be doing much better. Electronic Signature(s) Signed: 01/19/2018 8:35:34 AM By: Lenda Kelp PA-C Entered By: Lenda Kelp on 01/18/2018 15:04:09 Shawn Colon ([id number]) -------------------------------------------------------------------------------- Physician Orders Details Patient Name: Shawn Colon Date of Service: 01/18/2018 1:15 PM Medical Record Number: [id number] Patient Account Number: [id number] Date of Birth/Sex: 20-Sep-1945 (73 y.o. 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 o May Shower, gently pat wound dry prior to applying new dressing. - Please wash wound with Dial antibacterial soap in the shower Anesthetic (add to Medication List) Wound #1 Right,Medial Calcaneus o Topical Lidocaine 4% cream applied to wound bed prior to debridement (In Clinic Only). Primary Wound Dressing Wound #1 Right,Medial Calcaneus o Silver Collagen - moisten with saline Secondary Dressing Wound #1 Right,Medial Calcaneus o Boardered Foam Dressing - secure with  netting Dressing Change Frequency Wound #1 Right,Medial Calcaneus o Change dressing every other day. Follow-up Appointments Wound #1 Right,Medial Calcaneus o Return Appointment in 1 week. Off-Loading Wound #1 Right,Medial Calcaneus o Other: - keep pressure off your heel as much as you can Electronic Signature(s) GENEVIEVE, RITZEL ([id number]) Signed: 01/18/2018 4:56:07 PM By: Curtis Sites Signed: 01/19/2018 8:35:34 AM By: Lenda Kelp PA-C Entered By: Curtis Sites on 01/18/2018 14:58:37 EL, PILE ([id number]) -------------------------------------------------------------------------------- Problem List Details Patient Name: Shawn Colon Date of Service: 01/18/2018 1:15 PM Medical Record Number: [id number] Patient Account Number: [id number] Date of Birth/Sex: [birth year] (73 y.o. 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, stage 3 01/18/2018 No Yes G90.09 Other idiopathic peripheral autonomic neuropathy 01/18/2018 No Yes I10 Essential (primary) hypertension 01/18/2018 No Yes Inactive Problems Resolved Problems Electronic Signature(s) Signed: 01/19/2018 8:35:34 AM By: Lenda Kelp PA-C Entered By: Lenda Kelp on 01/18/2018 15:01:37 Shawn Colon ([id number]) -------------------------------------------------------------------------------- Progress Note Details Patient Name: Shawn Colon Date of Service: 01/18/2018 1:15 PM Medical Record Number: [id number] Patient Account Number: [id number] Date of Birth/Sex: November 25, 1945 (73 y.o. Male) Treating RN: Curtis Sites Primary Care Provider: Wynn Banker Other Clinician: Referring Provider: Wynn Banker Treating Provider/Extender: Linwood Dibbles, Deroy Noah Weeks in  Treatment: 0 Subjective Chief Complaint Information obtained from Patient Right heel ulcer History of Present Illness (HPI) 01/18/18 on evaluation today patient presents for evaluation concerning issues that have been present for roughly 3 weeks after the patient sustained an injury to his shoulder due to a fall. He was in the bed for about three weeks. Of time and states that during that time this ulcer appeared. It sounds like it may have been a deep tissue injury initially and the appearance seems to lend itself to that as well. Fortunately there is no signs of infection at this time. No fevers, chills, nausea, or vomiting noted at this time. He does not have a history of diabetes although he states that he has been told it is "borderline". He does have a history of hypertension and is on blood thinners. With that being said I see no evidence of this point of any worsening in fact this seems to be healing. His ABI's were noncompressible but again he seems to have a warm foot with good capillary refill and again the wound seems to be healing well already I'm gonna hold off on ordering arterial studies at this point. Wound History Patient presents with 1 open wound that has been present for approximately 1 month. Patient has been treating wound in the following manner: clotrimazole cream, oral antibiotics. Laboratory tests have not been performed in the last month. Patient reportedly has not tested positive for an antibiotic resistant organism. Patient reportedly has not tested positive for osteomyelitis. Patient reportedly has not had testing performed to evaluate circulation in the legs. Patient experiences the following problems associated with their wounds: swelling. Patient History Information obtained from Patient. Allergies Septra (Reaction: don't remember), Sulfa (Sulfonamide Antibiotics) (Reaction: flu symptoms), Flagyl, Levaquin, morphine Family History Cancer - Father, No family  history of Diabetes, Heart Disease, Hereditary Spherocytosis, Hypertension, Kidney Disease, Lung Disease, Seizures, Stroke, Thyroid Problems, Tuberculosis. Social History Former smoker - 53 years - ended on 01/13/2008, Marital Status - Married, Alcohol Use - Never, Drug Use - No History, Caffeine Use - Daily - coffee. Medical History Cardiovascular Patient has history of Hypertension, Phlebitis - 1999 Integumentary (Skin) Denies history of History of Burn, History of pressure wounds Musculoskeletal Patient has history of Osteomyelitis - 2011 Left Heel Neurologic AVROM, ROBARTS ([id number]) Patient has history of Neuropathy - 1997 Oncologic Denies history of Received Chemotherapy, Received Radiation Hospitalization/Surgery History - 01/12/2009, [place], Left heel. Medical And Surgical History Notes Respiratory [facility] Genitourinary Renal agenesis Review of Systems (ROS) Constitutional Symptoms (General Health) The patient has no complaints or symptoms. Eyes Complains or has symptoms of Glasses / Contacts - reading glasses. Ear/Nose/Mouth/Throat The patient has no complaints or symptoms. Hematologic/Lymphatic The patient has no complaints or symptoms. Respiratory The patient has no complaints or symptoms. Cardiovascular The patient has no complaints or symptoms. Gastrointestinal The  patient has no complaints or symptoms. Endocrine The patient has no complaints or symptoms. Immunological The patient has no complaints or symptoms. Integumentary (Skin) Complains or has symptoms of Wounds - 1, Swelling - legs. Denies complaints or symptoms of Bleeding or bruising tendency, Breakdown. Musculoskeletal Arthritis Neurologic Complains or has symptoms of Numbness/parasthesias - Right fingers due to shoulder pain from a fall. Oncologic The patient has no complaints or symptoms. Psychiatric The patient has no complaints or symptoms. Objective Constitutional patient is  hypertensive.. pulse regular and within target range for patient.Marland Kitchen respirations regular, non-labored and within target range for patient.Marland Kitchen temperature within target range for patient.. Well-nourished and well-hydrated in no acute distress. Vitals Time Taken: 1:21 PM, Height: 70 in, Source: Stated, Weight: 156 lbs, Source: Stated, BMI: 22.4, Temperature: 97.9 F, Pulse: 63 bpm, Respiratory Rate: 16 breaths/min, Blood Pressure: 141/41 mmHg. KHADER, BURGY ([id number]) Eyes conjunctiva clear no eyelid edema noted. pupils equal round and reactive to light and accommodation. Ears, Nose, Mouth, and Throat no gross abnormality of ear auricles or external auditory canals. normal hearing noted during conversation. mucus membranes moist. Respiratory normal breathing without difficulty. clear to auscultation bilaterally. Cardiovascular regular rate and rhythm with normal S1, S2. 1+ dorsalis pedis/posterior tibialis pulses. no clubbing, cyanosis, significant edema, Gastrointestinal (GI) soft, non-tender, non-distended, +BS. no ventral hernia noted. Musculoskeletal normal gait and posture. no significant deformity or arthritic changes, no loss or range of motion, no clubbing. Psychiatric this patient is able to make decisions and demonstrates good insight into disease process. Alert and Oriented x 3. pleasant and cooperative. General Notes: Patient's wound bed actually did have necrotic tissue noted around the edge of the wound a lot of this was eschar and when we moved it show complete epithelialization underneath. Patient's blood pressure was somewhat elevated today. No fevers, chills, nausea, or vomiting noted at this time. Fortunately his shoulder is feeling better. Post debridement the wound bed appears to be doing much better. Integumentary (Hair, Skin) Wound #1 status is Open. Original cause of wound was Shear/Friction. The wound is located on the Right,Medial Calcaneus. The wound measures  2.7cm length x 2.2cm width x 0.1cm depth; 4.665cm^2 area and 0.467cm^3 volume. There is Fat Layer (Subcutaneous Tissue) Exposed exposed. There is no tunneling or undermining noted. There is a none present amount of drainage noted. The wound margin is thickened. There is no granulation within the wound bed. There is a large (67-100%) amount of necrotic tissue within the wound bed including Eschar and Adherent Slough. The periwound skin appearance exhibited: Dry/Scaly, Erythema. The surrounding wound skin color is noted with erythema which is circumferential. Periwound temperature was noted as No Abnormality. The periwound has tenderness on palpation. 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 There was a Excisional Skin/Subcutaneous Tissue Debridement with a total area of 5.94 sq cm performed by STONE III, Tayjon Halladay E., PA-C. With the following instrument(s): Forceps, and Scissors to remove Viable and Non-Viable tissue/material. Material removed includes Eschar, Subcutaneous Tissue, Skin: Dermis, and Skin: Epidermis after achieving pain control using Lidocaine 4% Topical Solution. No specimens were taken. A time out was conducted at 14:44, prior to the start of the procedure. A Minimum amount of bleeding was controlled with Pressure. The procedure was tolerated well with a pain level of 0 throughout and a pain level of  0 following the procedure. Post Debridement Measurements: 2.9cm length x 3.2cm width x 0.1cm depth; 0.729cm^3 volume. Character of Wound/Ulcer Post Debridement is improved. Post procedure Diagnosis Wound #1: Same as Pre-Procedure Plan Wound Cleansing: Wound #1 Right,Medial Calcaneus: Clean wound with Normal Saline. May Shower,  gently pat wound dry prior to applying new dressing. - Please wash wound with Dial antibacterial soap in the shower Anesthetic (add to Medication List): Wound #1 Right,Medial Calcaneus: Topical Lidocaine 4% cream applied to wound bed prior to debridement (In Clinic Only). Primary Wound Dressing: Wound #1 Right,Medial Calcaneus: Silver Collagen - moisten with saline Secondary Dressing: Wound #1 Right,Medial Calcaneus: Boardered Foam Dressing - secure with netting Dressing Change Frequency: Wound #1 Right,Medial Calcaneus: Change dressing every other day. Follow-up Appointments: Wound #1 Right,Medial Calcaneus: Return Appointment in 1 week. Off-Loading: Wound #1 Right,Medial Calcaneus: Other: - keep pressure off your heel as much as you can I'm gonna suggest currently that we initiate the above wound care measures for the next week. The patient has been on on the self and is still taking this I think that is definitely fine for him to continue taking. If anything changes or worsens he will contact the office and let me know otherwise will see were things stand in one week. Please see above for specific wound care orders. We will see patient for re-evaluation in 1 week(s) here in the clinic. If anything worsens or changes patient will contact our office for additional recommendations. Electronic Signature(s) Signed: 01/19/2018 8:35:34 AM By: Kennis Carina, [place] ([id number]) Entered By: Lenda Kelp on 01/18/2018 15:04:39 Shawn Colon ([id number]) -------------------------------------------------------------------------------- ROS/PFSH Details Patient Name: Shawn Colon Date of Service: 01/18/2018 1:15 PM Medical Record Number: [id number] Patient Account Number: [id number] Date of Birth/Sex: 1945-07-13 (73 y.o. Male) Treating RN: Arnette Norris Primary Care Provider: Wynn Banker Other Clinician: Referring Provider: Wynn Banker Treating Provider/Extender: STONE  III, Nova Evett Weeks in Treatment: 0 Information Obtained From Patient Wound History Do you currently have one or more open woundso Yes How many open wounds do you currently haveo 1 Approximately how long have you had your woundso 1 month How have you been treating your wound(s) until nowo clotrimazole cream, oral antibiotics Has your wound(s) ever healed and then re-openedo No Have you had any lab work done in the past montho No Have you tested positive for an antibiotic resistant organism (MRSA, VRE)o No Have you tested positive for osteomyelitis (bone infection)o No Have you had any tests for circulation on your legso No Have you had other problems associated with your woundso Swelling Eyes Complaints and Symptoms: Positive for: Glasses / Contacts - reading glasses Integumentary (Skin) Complaints and Symptoms: Positive for: Wounds - 1; Swelling - legs Negative for: Bleeding or bruising tendency; Breakdown Medical History: Negative for: History of Burn; History of pressure wounds Neurologic Complaints and Symptoms: Positive for: Numbness/parasthesias - Right fingers due to shoulder pain from a fall Medical History: Positive for: Neuropathy - 1997 Constitutional Symptoms (General Health) Complaints and Symptoms: No Complaints or Symptoms Ear/Nose/Mouth/Throat Complaints and Symptoms: No Complaints or Symptoms Hematologic/Lymphatic DOMENICO, MCCLARY ([id number]) Complaints and Symptoms: No Complaints or Symptoms Respiratory Complaints and Symptoms: No Complaints or Symptoms Medical History: Past Medical History Notes: [facility] Cardiovascular Complaints and Symptoms: No Complaints or Symptoms Medical History: Positive for: Hypertension; Phlebitis - 1999 Gastrointestinal Complaints and Symptoms: No Complaints or Symptoms Endocrine Complaints and Symptoms: No Complaints or Symptoms Genitourinary Medical History: Past Medical History Notes: Renal  agenesis Immunological Complaints and Symptoms:  No Complaints or Symptoms Musculoskeletal Complaints and Symptoms: Review of System Notes: Arthritis Medical History: 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 Oncologic Complaints and Symptoms: No Complaints or Symptoms Medical History: Negative for: Received Chemotherapy; Received Radiation Psychiatric Shawn Colon, Jahaad ([id number]) Complaints and Symptoms: No Complaints or Symptoms Immunizations Pneumococcal Vaccine: Received Pneumococcal Vaccination: Yes Implantable Devices Hospitalization / Surgery History Name of Hospital Purpose of Hospitalization/Surgery Date [place] Left heel 01/12/2009 Family and Social History Cancer: Yes - Father; Diabetes: No; Heart Disease: No; Hereditary Spherocytosis: No; Hypertension: No; Kidney Disease: No; Lung Disease: No; Seizures: No; Stroke: No; Thyroid Problems: No; Tuberculosis: No; Former smoker - 53 years - ended on 01/13/2008; Marital Status - Married; Alcohol Use: Never; Drug Use: No History; Caffeine Use: Daily - coffee; Financial Concerns: No; Food, Clothing or Shelter Needs: No; Support System Lacking: No; Transportation Concerns: No; Advanced Directives: No; Patient does not want information on Advanced Directives; Living Will: Yes (Not Provided) Electronic Signature(s) Signed: 01/19/2018 8:35:34 AM By: Lenda KelpStone III, Macee Venables PA-C Signed: 01/20/2018 2:41:13 PM By: Arnette NorrisBiell, Kristina Entered By: Arnette NorrisBiell, Kristina on 01/18/2018 14:22:04 Shawn Colon, Shawn Colon ([id number]) -------------------------------------------------------------------------------- SuperBill Details Patient Name: Shawn Colon, Shawn Colon Date of Service: 01/18/2018 Medical Record Number: [id number] Patient Account Number: [id number] Date of Birth/Sex: 1945-06-15 [age] y.o. 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, stage 3 G90.09 Other idiopathic peripheral autonomic neuropathy I10 Essential (primary) hypertension Facility Procedures CPT4 Code: [id number] Description: 99213 - WOUND CARE VISIT-LEV 3 EST PT Modifier: Quantity: 1 CPT4 Code: [id number] Description: 11042 - DEB SUBQ TISSUE 20 SQ CM/< ICD-10 Diagnosis Description 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, stage 3 Modifier: Quantity: 1 Physician Procedures CPT4 Code: [id number] Description: WC PHYS LEVEL 3 o NEW PT ICD-10 Diagnosis Description 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

## 2018-01-25 ENCOUNTER — Encounter: Payer: Medicare Other | Admitting: Physician Assistant

## 2018-01-25 DIAGNOSIS — E11622 Type 2 diabetes mellitus with other skin ulcer: Secondary | ICD-10-CM | POA: Diagnosis not present

## 2018-01-28 NOTE — Progress Notes (Signed)
Shawn Colon, Ladd ([id number]) Visit Report for 01/25/2018 Arrival Information Details Patient Name: Shawn Colon, Shawn Colon Date of Service: 01/25/2018 3:00 PM Medical Record Number: [id number] Patient Account Number: [id number] Date of Birth/Sex: 1945-01-21 [age] y.o. M) Treating RN: Arnette NorrisBiell, Kristina Primary Care Evangelina Delancey: Wynn BankerAVIS, DEBRA Other Clinician: Referring Kimi Kroft: Wynn BankerAVIS, DEBRA Treating Rawley Harju/Extender: Linwood DibblesSTONE III, HOYT Weeks in Treatment: 1 Visit Information History Since Last Visit Added or deleted any medications: No Patient Arrived: Ambulatory Any new allergies or adverse reactions: No Arrival Time: 15:32 Had a fall or experienced change in No Accompanied By: wife activities of daily living that may affect Transfer Assistance: None risk of falls: Patient Has Alerts: Yes Signs or symptoms of abuse/neglect since last visito No Patient Alerts: Patient on Blood Thinner Hospitalized since last visit: No Warfarin Has Dressing in Place as Prescribed: Yes ABI [place] BILATERAL >220 Pain Present Now: Yes Electronic Signature(s) Signed: 01/25/2018 5:22:36 PM By: Curtis Sitesorthy, Joanna Entered By: Curtis Sitesorthy, Joanna on 01/25/2018 16:11:43 Shawn Colon, Giancarlos ([id number]) -------------------------------------------------------------------------------- Clinic Level of Care Assessment Details Patient Name: Shawn Colon, Shawn Colon Date of Service: 01/25/2018 3:00 PM Medical Record Number: [id number] Patient Account Number: [id number] Date of Birth/Sex: 1945-01-21 [age] y.o. M) Treating RN: Curtis Sitesorthy, Joanna Primary Care Kenyan Karnes: Wynn BankerAVIS, DEBRA Other Clinician: Referring Doloros Kwolek: Wynn BankerAVIS, DEBRA Treating Hazel Leveille/Extender: STONE III, HOYT Weeks in Treatment: 1 Clinic Level of Care Assessment Items TOOL 4 Quantity Score []  - Use when only an EandM is performed on FOLLOW-UP visit 0 ASSESSMENTS - Nursing Assessment / Reassessment X - Reassessment of Co-morbidities (includes updates in patient status) 1 10 X- 1 5 Reassessment of  Adherence to Treatment Plan ASSESSMENTS - Wound and Skin Assessment / Reassessment X - Simple Wound Assessment / Reassessment - one wound 1 5 []  - 0 Complex Wound Assessment / Reassessment - multiple wounds []  - 0 Dermatologic / Skin Assessment (not related to wound area) ASSESSMENTS - Focused Assessment []  - Circumferential Edema Measurements - multi extremities 0 []  - 0 Nutritional Assessment / Counseling / Intervention X- 1 5 Lower Extremity Assessment (monofilament, tuning fork, pulses) []  - 0 Peripheral Arterial Disease Assessment (using hand held doppler) ASSESSMENTS - Ostomy and/or Continence Assessment and Care []  - Incontinence Assessment and Management 0 []  - 0 Ostomy Care Assessment and Management (repouching, etc.) PROCESS - Coordination of Care X - Simple Patient / Family Education for ongoing care 1 15 []  - 0 Complex (extensive) Patient / Family Education for ongoing care X- 1 10 Staff obtains ChiropractorConsents, Records, Test Results / Process Orders []  - 0 Staff telephones HHA, Nursing Homes / Clarify orders / etc []  - 0 Routine Transfer to another Facility (non-emergent condition) []  - 0 Routine Hospital Admission (non-emergent condition) []  - 0 New Admissions / Manufacturing engineernsurance Authorizations / Ordering NPWT, Apligraf, etc. []  - 0 Emergency Hospital Admission (emergent condition) X- 1 10 Simple Discharge Coordination Shawn Colon, Shawn Colon ([id number]) []  - 0 Complex (extensive) Discharge Coordination PROCESS - Special Needs []  - Pediatric / Minor Patient Management 0 []  - 0 Isolation Patient Management []  - 0 Hearing / Language / Visual special needs []  - 0 Assessment of Community assistance (transportation, D/C planning, etc.) []  - 0 Additional assistance / Altered mentation []  - 0 Support Surface(s) Assessment (bed, cushion, seat, etc.) INTERVENTIONS - Wound Cleansing / Measurement X - Simple Wound Cleansing - one wound 1 5 []  - 0 Complex Wound Cleansing - multiple  wounds X- 1 5 Wound Imaging (photographs - any number of wounds) []  - 0 Wound Tracing (instead of photographs) X- 1 5 Simple  Wound Measurement - one wound []  - 0 Complex Wound Measurement - multiple wounds INTERVENTIONS - Wound Dressings X - Small Wound Dressing one or multiple wounds 1 10 []  - 0 Medium Wound Dressing one or multiple wounds []  - 0 Large Wound Dressing one or multiple wounds []  - 0 Application of Medications - topical []  - 0 Application of Medications - injection INTERVENTIONS - Miscellaneous []  - External ear exam 0 []  - 0 Specimen Collection (cultures, biopsies, blood, body fluids, etc.) []  - 0 Specimen(s) / Culture(s) sent or taken to Lab for analysis []  - 0 Patient Transfer (multiple staff / Michiel Sites Lift / Similar devices) []  - 0 Simple Staple / Suture removal (25 or less) []  - 0 Complex Staple / Suture removal (26 or more) []  - 0 Hypo / Hyperglycemic Management (close monitor of Blood Glucose) []  - 0 Ankle / Brachial Index (ABI) - do not check if billed separately X- 1 5 Vital Signs Fellows, Joeph ([id number]) Has the patient been seen at the hospital within the last three years: Yes Total Score: 90 Level Of Care: New/Established - Level 3 Electronic Signature(s) Signed: 01/25/2018 5:22:36 PM By: Curtis Sites Entered By: Curtis Sites on 01/25/2018 16:20:08 Shawn Hartigan ([id number]) -------------------------------------------------------------------------------- Encounter Discharge Information Details Patient Name: Shawn Hartigan Date of Service: 01/25/2018 3:00 PM Medical Record Number: [id number] Patient Account Number: [id number] Date of Birth/Sex: [birth year] (73 y.o. M) Treating RN: Curtis Sites Primary Care Hyland Mollenkopf: Wynn Banker Other Clinician: Referring Nardos Putnam: Wynn Banker Treating Jaslyn Bansal/Extender: Linwood Dibbles, HOYT Weeks in Treatment: 1 Encounter Discharge Information Items Discharge Condition: Stable Ambulatory Status:  Ambulatory Discharge Destination: Home Transportation: Private Auto Accompanied By: wife Schedule Follow-up Appointment: Yes Clinical Summary of Care: Provided on 01/25/2018 Form Type Recipient Paper Patient jc Electronic Signature(s) Signed: 01/25/2018 5:02:52 PM By: Dayton Martes RCP, RRT, CHT Entered By: Dayton Martes on 01/25/2018 16:26:50 Shawn Hartigan ([id number]) -------------------------------------------------------------------------------- Lower Extremity Assessment Details Patient Name: Shawn Hartigan Date of Service: 01/25/2018 3:00 PM Medical Record Number: [id number] Patient Account Number: [id number] Date of Birth/Sex: Sep 03, 1945 (72 y.o. M) Treating RN: Arnette Norris Primary Care Eriyah Fernando: Wynn Banker Other Clinician: Referring Sheikh Leverich: Wynn Banker Treating Almeda Ezra/Extender: Linwood Dibbles, HOYT Weeks in Treatment: 1 Electronic Signature(s) Signed: 01/27/2018 4:21:01 PM By: Arnette Norris Entered By: Arnette Norris on 01/25/2018 15:42:02 Shawn Hartigan ([id number]) -------------------------------------------------------------------------------- Multi Wound Chart Details Patient Name: Shawn Hartigan Date of Service: 01/25/2018 3:00 PM Medical Record Number: [id number] Patient Account Number: [id number] Date of Birth/Sex: [birth year] (72 y.o. M) Treating RN: Curtis Sites Primary Care Xylia Scherger: Wynn Banker Other Clinician: Referring Ky Moskowitz: Wynn Banker Treating Purvis Sidle/Extender: STONE III, HOYT Weeks in Treatment: 1 Vital Signs Height(in): 70 Pulse(bpm): 62 Weight(lbs): 156 Blood Pressure(mmHg): 129/46 Body Mass Index(BMI): 22 Temperature(F): 98.3 Respiratory Rate 18 (breaths/min): Photos: [1:No Photos] [N/A:N/A] Wound Location: [1:Right Calcaneus - Medial] [N/A:N/A] Wounding Event: [1:Shear/Friction] [N/A:N/A] Primary Etiology: [1:Pressure Ulcer] [N/A:N/A] Comorbid History: [1:Hypertension, Phlebitis,  Osteomyelitis, Neuropathy] [N/A:N/A] Date Acquired: [1:12/18/2017] [N/A:N/A] Weeks of Treatment: [1:1] [N/A:N/A] Wound Status: [1:Open] [N/A:N/A] Measurements L x W x D [1:2.7x2.2x0.1] [N/A:N/A] (cm) Area (cm) : [1:4.665] [N/A:N/A] Volume (cm) : [1:0.467] [N/A:N/A] % Reduction in Area: [1:0.00%] [N/A:N/A] % Reduction in Volume: [1:0.00%] [N/A:N/A] Classification: [1:Category/Stage III] [N/A:N/A] Exudate Amount: [1:None Present] [N/A:N/A] Wound Margin: [1:Thickened] [N/A:N/A] Granulation Amount: [1:None Present (0%)] [N/A:N/A] Necrotic Amount: [1:Large (67-100%)] [N/A:N/A] Necrotic Tissue: [1:Eschar, Adherent Slough] [N/A:N/A] Exposed Structures: [1:Fat Layer (Subcutaneous Tissue) Exposed: Yes Fascia: No Tendon: No Muscle: No Joint: No Bone: No] [N/A:N/A] Epithelialization: [1:None] [N/A:N/A] Periwound Skin  Texture: [1:No Abnormalities Noted] [N/A:N/A] Periwound Skin Moisture: [1:Dry/Scaly: Yes] [N/A:N/A] Periwound Skin Color: [1:Erythema: Yes] [N/A:N/A] Erythema Location: [1:Circumferential] [N/A:N/A] Temperature: [1:No Abnormality] [N/A:N/A] Tenderness on Palpation: [1:Yes] [N/A:N/A] Wound Preparation: [1:Ulcer Cleansing: Rinsed/Irrigated with Saline] [N/A:N/A] Topical Anesthetic Applied: Other: lidocaine 4% Treatment Notes Electronic Signature(s) Signed: 01/25/2018 5:22:36 PM By: Curtis Sitesorthy, Joanna Entered By: Curtis Sitesorthy, Joanna on 01/25/2018 16:09:40 Shawn Colon, Ed ([id number]) -------------------------------------------------------------------------------- Multi-Disciplinary Care Plan Details Patient Name: Shawn Colon, Shawn Colon Date of Service: 01/25/2018 3:00 PM Medical Record Number: [id number] Patient Account Number: [id number] Date of Birth/Sex: 09-26-1945 (72 y.o. M) Treating RN: Curtis Sitesorthy, Joanna Primary Care Dhara Schepp: Wynn BankerAVIS, DEBRA Other Clinician: Referring Daylen Lipsky: Wynn BankerAVIS, DEBRA Treating Niva Murren/Extender: Linwood DibblesSTONE III, HOYT Weeks in Treatment: 1 Active Inactive Abuse / Safety  / Falls / Self Care Management Nursing Diagnoses: Potential for falls Goals: Patient will not experience any injury related to falls Date Initiated: 01/18/2018 Target Resolution Date: 04/16/2018 Goal Status: Active Interventions: Assess fall risk on admission and as needed Notes: Orientation to the Wound Care Program Nursing Diagnoses: Knowledge deficit related to the wound healing center program Goals: Patient/caregiver will verbalize understanding of the Wound Healing Center Program Date Initiated: 01/18/2018 Target Resolution Date: 04/16/2018 Goal Status: Active Interventions: Provide education on orientation to the wound center Notes: Wound/Skin Impairment Nursing Diagnoses: Impaired tissue integrity Goals: Ulcer/skin breakdown will heal within 14 weeks Date Initiated: 01/18/2018 Target Resolution Date: 04/16/2018 Goal Status: Active Interventions: Assess patient/caregiver ability to obtain necessary supplies Shawn Colon, Medhansh ([id number]) Assess patient/caregiver ability to perform ulcer/skin care regimen upon admission and as needed Assess ulceration(s) every visit Notes: Electronic Signature(s) Signed: 01/25/2018 5:22:36 PM By: Curtis Sitesorthy, Joanna Entered By: Curtis Sitesorthy, Joanna on 01/25/2018 16:08:56 Shawn Colon, Kyland ([id number]) -------------------------------------------------------------------------------- Pain Assessment Details Patient Name: Shawn Colon, Shawn Colon Date of Service: 01/25/2018 3:00 PM Medical Record Number: [id number] Patient Account Number: [id number] Date of Birth/Sex: 09-26-1945 [age] y.o. M) Treating RN: Arnette NorrisBiell, Kristina Primary Care Keyli Duross: Wynn BankerAVIS, DEBRA Other Clinician: Referring Janeth Terry: Wynn BankerAVIS, DEBRA Treating Mariha Sleeper/Extender: STONE III, HOYT Weeks in Treatment: 1 Active Problems Location of Pain Severity and Description of Pain Patient Has Paino Yes Site Locations Pain Location: Pain in Ulcers With Dressing Change: Yes Rate the pain. Current Pain Level:  5 Pain Management and Medication Current Pain Management: Electronic Signature(s) Signed: 01/27/2018 4:21:01 PM By: Arnette NorrisBiell, Kristina Entered By: Arnette NorrisBiell, Kristina on 01/25/2018 15:34:30 Shawn Colon, Shawn Colon ([id number]) -------------------------------------------------------------------------------- Patient/Caregiver Education Details Patient Name: Shawn Colon, Shawn Colon Date of Service: 01/25/2018 3:00 PM Medical Record Number: [id number] Patient Account Number: [id number] Date of Birth/Gender: 09-26-1945 (72 y.o. M) Treating RN: Curtis Sitesorthy, Joanna Primary Care Physician: Wynn BankerAVIS, DEBRA Other Clinician: Referring Physician: Wynn BankerAVIS, DEBRA Treating Physician/Extender: Skeet SimmerSTONE III, HOYT Weeks in Treatment: 1 Education Assessment Education Provided To: Patient and Caregiver Education Topics Provided Wound/Skin Impairment: Handouts: Other: wound care as ordered Methods: Demonstration, Explain/Verbal Responses: State content correctly Electronic Signature(s) Signed: 01/25/2018 5:22:36 PM By: Curtis Sitesorthy, Joanna Entered By: Curtis Sitesorthy, Joanna on 01/25/2018 16:20:33 Shawn Colon, Shawn Colon ([id number]) -------------------------------------------------------------------------------- Wound Assessment Details Patient Name: Shawn Colon, Shawn Colon Date of Service: 01/25/2018 3:00 PM Medical Record Number: [id number] Patient Account Number: [id number] Date of Birth/Sex: 09-26-1945 [age] y.o. M) Treating RN: Arnette NorrisBiell, Kristina Primary Care Rayshun Kandler: Wynn BankerAVIS, DEBRA Other Clinician: Referring Merion Grimaldo: Wynn BankerAVIS, DEBRA Treating Ammie Warrick/Extender: STONE III, HOYT Weeks in Treatment: 1 Wound Status Wound Number: 1 Primary Pressure Ulcer Etiology: Wound Location: Right Calcaneus - Medial Wound Status: Open Wounding Event: Shear/Friction Comorbid Hypertension, Phlebitis, Osteomyelitis, Date Acquired: 12/18/2017 History: Neuropathy Weeks Of Treatment: 1 Clustered Wound: No Photos Wound Measurements Length: (cm) 2.7 % Reduction Width: (cm) 2.2 %  Reduction Depth: (cm) 0.1 Epithelializ Area: (cm) 4.665 Tunneling: Volume: (cm) 0.467 Undermining in Area: 0% in Volume: 0% ation: None No : No Wound Description Classification: Category/Stage III Foul Odor Af Wound Margin: Thickened Slough/Fibri Exudate Amount: Medium Exudate Type: Serous Exudate Color: amber ter Cleansing: No no Yes Wound Bed Granulation Amount: None Present (0%) Exposed Structure Necrotic Amount: Large (67-100%) Fascia Exposed: No Necrotic Quality: Eschar, Adherent Slough Fat Layer (Subcutaneous Tissue) Exposed: Yes Tendon Exposed: No Muscle Exposed: No Joint Exposed: No Bone Exposed: No Periwound Skin Texture Texture Color MORAD, TAL ([id number]) No Abnormalities Noted: No No Abnormalities Noted: No Erythema: Yes Moisture Erythema Location: Circumferential No Abnormalities Noted: No Dry / Scaly: Yes Temperature / Pain Temperature: No Abnormality Tenderness on Palpation: Yes Wound Preparation Ulcer Cleansing: Rinsed/Irrigated with Saline Topical Anesthetic Applied: Other: lidocaine 4%, Treatment Notes Wound #1 (Right, Medial Calcaneus) 4. Dressing Applied: Iodoflex Notes bordered foam dressing and secured with netting Electronic Signature(s) Signed: 01/25/2018 5:00:49 PM By: Curtis Sites Signed: 01/27/2018 4:21:01 PM By: Arnette Norris Entered By: Curtis Sites on 01/25/2018 17:00:49 NAIF, ALABI ([id number]) -------------------------------------------------------------------------------- Vitals Details Patient Name: Shawn Hartigan Date of Service: 01/25/2018 3:00 PM Medical Record Number: [id number] Patient Account Number: [id number] Date of Birth/Sex: 16-Sep-1945 (73 y.o. M) Treating RN: Arnette Norris Primary Care Abhimanyu Cruces: Wynn Banker Other Clinician: Referring Mieke Brinley: Wynn Banker Treating Nataniel Gasper/Extender: STONE III, HOYT Weeks in Treatment: 1 Vital Signs Time Taken: 15:36 Temperature (F): 98.3 Height  (in): 70 Pulse (bpm): 62 Weight (lbs): 156 Respiratory Rate (breaths/min): 18 Body Mass Index (BMI): 22.4 Blood Pressure (mmHg): 129/46 Reference Range: 80 - 120 mg / dl Electronic Signature(s) Signed: 01/27/2018 4:21:01 PM By: Arnette Norris Entered By: Arnette Norris on 01/25/2018 15:37:48

## 2018-01-28 NOTE — Progress Notes (Signed)
Shawn Colon, Shawn Colon ([id number]) Visit Report for 01/25/2018 Chief Complaint Document Details Patient Name: Shawn Colon Date of Service: 01/25/2018 3:00 PM Medical Record Number: [id number] Patient Account Number: [id number] Date of Birth/Sex: 1945-06-02 (73 y.o. M) Treating RN: Arnette Norris Primary Care Provider: Wynn Banker Other Clinician: Referring Provider: Wynn Banker Treating Provider/Extender: Linwood Dibbles, HOYT Weeks in Treatment: 1 Information Obtained from: Patient Chief Complaint Right heel ulcer Electronic Signature(s) Signed: 01/25/2018 5:17:02 PM By: Lenda Kelp PA-C Entered By: Lenda Kelp on 01/25/2018 15:55:02 Shawn Colon, Shawn Colon ([id number]) -------------------------------------------------------------------------------- HPI Details Patient Name: Shawn Colon Date of Service: 01/25/2018 3:00 PM Medical Record Number: [id number] Patient Account Number: [id number] Date of Birth/Sex: 15-Apr-1945 (72 y.o. M) Treating RN: Arnette Norris Primary Care Provider: Wynn Banker Other Clinician: Referring Provider: Wynn Banker Treating Provider/Extender: Linwood Dibbles, HOYT Weeks in Treatment: 1 History of Present Illness HPI Description: 01/18/18 on evaluation today patient presents for evaluation concerning issues that have been present for roughly 3 weeks after the patient sustained an injury to his shoulder due to a fall. He was in the bed for about three weeks. Of time and states that during that time this ulcer appeared. It sounds like it may have been a deep tissue injury initially and the appearance seems to lend itself to that as well. Fortunately there is no signs of infection at this time. No fevers, chills, nausea, or vomiting noted at this time. He does not have a history of diabetes although he states that he has been told it is "borderline". He does have a history of hypertension and is on blood thinners. With that being said I see no evidence of this point of  any worsening in fact this seems to be healing. His ABI's were noncompressible but again he seems to have a warm foot with good capillary refill and again the wound seems to be healing well already I'm gonna hold off on ordering arterial studies at this point. 01/25/18 on evaluation today patient appears to be doing somewhat poorly in regard to his heel ulcer. They have been utilizing the Prisma unfortunately there does not seem to be a lot of improvement since last visit. Nonetheless I think that we may need to consider a referral to vascular for further evaluation of his arterial status. His ABI's were noncompressible. Electronic Signature(s) Signed: 01/25/2018 5:17:02 PM By: Lenda Kelp PA-C Entered By: Lenda Kelp on 01/25/2018 16:48:07 Shawn Colon ([id number]) -------------------------------------------------------------------------------- Physical Exam Details Patient Name: Shawn Colon Date of Service: 01/25/2018 3:00 PM Medical Record Number: [id number] Patient Account Number: [id number] Date of Birth/Sex: [birth year] (72 y.o. M) Treating RN: Arnette Norris Primary Care Provider: Wynn Banker Other Clinician: Referring Provider: Wynn Banker Treating Provider/Extender: STONE III, HOYT Weeks in Treatment: 1 Constitutional Well-nourished and well-hydrated in no acute distress. Respiratory normal breathing without difficulty. clear to auscultation bilaterally. Cardiovascular regular rate and rhythm with normal S1, S2. Absent posterior tibial and dorsalis pedis pulses bilateral lower extremities. 1+ pitting edema of the bilateral lower extremities. Psychiatric this patient is able to make decisions and demonstrates good insight into disease process. Alert and Oriented x 3. pleasant and cooperative. Notes Upon evaluation today he appears to show signs of somewhat sluggish capillary refill at this point somewhere around 4-5 seconds. With that being said this coupled with  the fact that even of the wound appear to be doing better last week and is now somewhat eschar back over and concerned that he does not have sufficient blood flow to appropriately heal  this wound. Nonetheless I think that we probably should get him to vascular to have this further evaluated. Electronic Signature(s) Signed: 01/25/2018 5:17:02 PM By: Lenda Kelp PA-C Entered By: Lenda Kelp on 01/25/2018 16:48:46 Shawn Colon, Shawn Colon ([id number]) -------------------------------------------------------------------------------- Physician Orders Details Patient Name: Shawn Colon Date of Service: 01/25/2018 3:00 PM Medical Record Number: [id number] Patient Account Number: [id number] Date of Birth/Sex: 21-Dec-1945 (72 y.o. 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 o May Shower, gently pat wound dry prior to applying new dressing. - Please wash wound with Dial antibacterial soap in the shower Anesthetic (add to Medication List) Wound #1 Right,Medial Calcaneus o Topical Lidocaine 4% cream applied to wound bed prior to debridement (In Clinic Only). Primary Wound Dressing Wound #1 Right,Medial Calcaneus o Iodoflex Secondary Dressing Wound #1 Right,Medial Calcaneus o Boardered Foam Dressing - secure with netting Dressing Change Frequency Wound #1 Right,Medial Calcaneus o Change dressing every other day. Follow-up Appointments Wound #1 Right,Medial Calcaneus o Return Appointment in 1 week. Off-Loading Wound #1 Right,Medial Calcaneus o  Other: - keep pressure off your heel as much as you can Services and Therapies o Arterial Studies- Bilateral Shawn Colon ([id number]) Electronic Signature(s) Signed: 01/25/2018 5:17:02 PM By: Lenda Kelp PA-C Entered By: Lenda Kelp on 01/25/2018 16:48:55 Shawn Colon, Shawn Colon ([id number]) -------------------------------------------------------------------------------- Problem List Details Patient Name: Shawn Colon Date of Service: 01/25/2018 3:00 PM Medical Record Number: [id number] Patient Account Number: [id number] Date of Birth/Sex: 09-30-1945 (72 y.o. 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, stage 3 01/18/2018 No Yes G90.09 Other idiopathic peripheral autonomic neuropathy 01/18/2018 No Yes I10 Essential (primary) hypertension 01/18/2018 No Yes Inactive Problems Resolved Problems Electronic Signature(s) Signed: 01/25/2018 5:17:02 PM By: Lenda Kelp PA-C Entered By: Lenda Kelp on 01/25/2018 15:54:52 Shawn Colon ([id number]) -------------------------------------------------------------------------------- Progress Note Details Patient Name: Shawn Colon Date of Service: 01/25/2018 3:00 PM Medical Record Number: [id number] Patient Account Number: [id number] Date of Birth/Sex: April 27, 1945 (72 y.o. M) Treating RN: Arnette Norris Primary Care Provider: Wynn Banker Other Clinician: Referring Provider: Wynn Banker Treating Provider/Extender: Linwood Dibbles, HOYT Weeks in Treatment: 1 Subjective Chief Complaint Information obtained from Patient Right heel ulcer History of Present Illness (HPI) 01/18/18 on evaluation today patient presents for evaluation concerning issues that have been present for  roughly 3 weeks after the patient sustained an injury to his shoulder due to a fall. He was in the bed for about three weeks. Of time and states that during that time this ulcer appeared. It sounds like it may have been a deep tissue injury initially and the appearance seems to lend itself to that as well. Fortunately there is no signs of infection at this time. No fevers, chills, nausea, or vomiting noted at this time. He does not have a history of diabetes although he states that he has been told it is "borderline". He does have a history of hypertension and is on blood thinners. With that being said I see no evidence of  this point of any worsening in fact this seems to be healing. His ABI's were noncompressible but again he seems to have a warm foot with good capillary refill and again the wound seems to be healing well already I'm gonna hold off on ordering arterial studies at this point. 01/25/18 on evaluation today patient appears to be doing somewhat poorly in regard to his heel ulcer. They have been utilizing the Prisma unfortunately there does not seem to be a lot of improvement since last visit. Nonetheless I think that we may need to consider a referral to vascular for further evaluation of his arterial status. His ABI's were noncompressible. Patient History Information obtained from Patient. Family History Cancer - Father, No family history of Diabetes, Heart Disease, Hereditary Spherocytosis, Hypertension, Kidney Disease, Lung Disease, Seizures, Stroke, Thyroid Problems, Tuberculosis. Social History Former smoker - 53 years - ended on 01/13/2008, Marital Status - Married, Alcohol Use - Never, Drug Use - No History, Caffeine Use - Daily - coffee. Medical History Hospitalization/Sur[place]. Medical And Surgical History Notes Respiratory [facility] Genitourinary Renal agenesis Review of Systems (ROS) Constitutional Symptoms (General  Health) Denies complaints or symptoms of Fever, Chills. Respiratory The patient has no complaints or symptoms. Shawn Colon, Shawn Colon ([id number]) Cardiovascular The patient has no complaints or symptoms. Psychiatric The patient has no complaints or symptoms. Objective Constitutional Well-nourished and well-hydrated in no acute distress. Vitals Time Taken: 3:36 PM, Height: 70 in, Weight: 156 lbs, BMI: 22.4, Temperature: 98.3 F, Pulse: 62 bpm, Respiratory Rate: 18 breaths/min, Blood Pressure: 129/46 mmHg. Respiratory normal breathing without difficulty. clear to auscultation bilaterally. Cardiovascular regular rate and rhythm with normal S1, S2. Absent posterior tibial and dorsalis pedis pulses bilateral lower extremities. 1+ pitting edema of the bilateral lower extremities. Psychiatric this patient is able to make decisions and demonstrates good insight into disease process. Alert and Oriented x 3. pleasant and cooperative. General Notes: Upon evaluation today he appears to show signs of somewhat sluggish capillary refill at this point somewhere around 4-5 seconds. With that being said this coupled with the fact that even of the wound appear to be doing better last week and is now somewhat eschar back over and concerned that he does not have sufficient blood flow to appropriately heal this wound. Nonetheless I think that we probably should get him to vascular to have this further evaluated. Integumentary (Hair, Skin) Wound #1 status is Open. Original cause of wound was Shear/Friction. The wound is located on the Right,Medial Calcaneus. The wound measures 2.7cm length x 2.2cm width x 0.1cm depth; 4.665cm^2 area and 0.467cm^3 volume. There is Fat Layer (Subcutaneous Tissue) Exposed exposed. There is no tunneling or undermining noted. There is a none present amount of drainage noted. The wound margin is thickened. There is no granulation within the wound bed. There is a large (67-100%) amount  of necrotic tissue within the wound bed including Eschar and Adherent Slough. The periwound skin appearance exhibited: Dry/Scaly, Erythema. The surrounding wound skin color is noted with erythema which is circumferential. Periwound temperature was noted as No Abnormality. The periwound has tenderness on palpation. 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 May Shower, gently pat wound dry prior to applying new dressing. - Please wash wound with Dial antibacterial soap in the shower Anesthetic (add to Medication List): Wound #1 Right,Medial Calcaneus:  Topical Lidocaine 4% cream applied to wound bed prior to debridement (In Clinic Only). Primary Wound Dressing: Wound #1 Right,Medial Calcaneus: Iodoflex Secondary Dressing: Wound #1 Right,Medial Calcaneus: Boardered Foam Dressing - secure with netting Dressing Change Frequency: Wound #1 Right,Medial Calcaneus: Change dressing every other day. Follow-up Appointments: Wound #1 Right,Medial Calcaneus: Return Appointment in 1 week. Off-Loading: Wound #1 Right,Medial Calcaneus: Other: - keep pressure off your heel as much as you can Services and Therapies ordered were: Arterial Studies- Bilateral My suggestion at this time is gonna be that we go ahead and make a referral for formal arterial studies. The patient is in agreement with this plan. We will subsequently see were things stand at follow-up. We will switch his dressings to the Iodoflex per above. Please see above for specific wound care orders. We will see patient for re-evaluation in 1 week(s) here in the clinic. If anything worsens or changes patient will contact our office for additional recommendations. Electronic Signature(s) Signed: 01/25/2018  5:17:02 PM By: Lenda KelpStone III, Hoyt PA-C Entered By: Lenda KelpStone III, Hoyt on 01/25/2018 16:49:10 Shawn Colon, Shawn Colon ([id number]) -------------------------------------------------------------------------------- ROS/PFSH Details Patient Name: Shawn Colon, Shawn Colon Date of Service: 01/25/2018 3:00 PM Medical Record Number: [id number] Patient Account Number: [id number] Date of Birth/Sex: [birth year] (72 y.o. M) Treating RN: Arnette NorrisBiell, Kristina Primary Care Provider: Wynn BankerAVIS, DEBRA Other Clinician: Referring Provider: Wynn BankerAVIS, DEBRA Treating Provider/Extender: STONE III, HOYT Weeks in Treatment: 1 Information Obtained From Patient Wound History Do you currently have one or more open woundso Yes How many open wounds do you currently haveo 1 Approximately how long have you had your woundso 1 month How have you been treating your wound(s) until nowo clotrimazole cream, oral antibiotics Has your wound(s) ever healed and then re-openedo No Have you had any lab work done in the past montho No Have you tested positive for an antibiotic resistant organism (MRSA, VRE)o No Have you tested positive for osteomyelitis (bone infection)o No Have you had any tests for circulation on your legso No Have you had other problems associated with your woundso Swelling Constitutional Symptoms (General Health) Complaints and Symptoms: Negative for: Fever; Chills Respiratory Complaints and Symptoms: No Complaints or Symptoms Medical History: Past Medical History Notes: [facility] Cardiovascular Complaints and Symptoms: No Complaints or Symptoms Medical History: Positive for: Hypertension; Phlebitis - 1999 Genitourinary Medical History: Past Medical History Notes: Renal agenesis Integumentary (Skin) Medical History: Negative for: History of Burn; History of pressure wounds Musculoskeletal Shawn Colon, Shawn Colon ([id number]) Medical History: 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 Neurologic Medical History: Positive for:  Neuropathy - 1997 Oncologic Medical History: Negative for: Received Chemotherapy; Received Radiation Psychiatric Complaints and Symptoms: No Complaints or Symptoms Immunizations Pneumococcal Vaccine: Received Pneumococcal Vaccination: Yes Implantable Devices Hospitalization / Surgery History Name of Hospital Purpose of Hospitalization/Surgery Date [place] Left heel 01/12/2009 Family and Social History Cancer: Yes - Father; Diabetes: No; Heart Disease: No; Hereditary Spherocytosis: No; Hypertension: No; Kidney Disease: No; Lung Disease: No; Seizures: No; Stroke: No; Thyroid Problems: No; Tuberculosis: No; Former smoker - 53 years - ended on 01/13/2008; Marital Status - Married; Alcohol Use: Never; Drug Use: No History; Caffeine Use: Daily - coffee; Financial Concerns: No; Food, Clothing or Shelter Needs: No; Support System Lacking: No; Transportation Concerns: No; Advanced Directives: No; Patient does not want information on Advanced Directives; Living Will: Yes (Not Provided) Physician Affirmation I have reviewed and agree with the above information. Electronic Signature(s) Signed: 01/25/2018 5:17:02 PM By: Lenda KelpStone III, Hoyt PA-C Signed: 01/27/2018 4:21:01 PM By: Arnette NorrisBiell, Kristina Entered By: Linwood DibblesStone III,  Hoyt on 01/25/2018 16:48:23 Shawn Colon, DECKARD ([id number]) -------------------------------------------------------------------------------- SuperBill Details Patient Name: EDELMIRO, INNOCENT Date of Service: 01/25/2018 Medical Record Number: [id number] Patient Account Number: [id number] Date of Birth/Sex: 1945/08/12 (73 y.o. 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

## 2018-02-01 ENCOUNTER — Encounter: Payer: Medicare Other | Admitting: Physician Assistant

## 2018-02-01 DIAGNOSIS — E11622 Type 2 diabetes mellitus with other skin ulcer: Secondary | ICD-10-CM | POA: Diagnosis not present

## 2018-02-03 NOTE — Progress Notes (Addendum)
JEZIEL, LIENHARD ([id number]) Visit Report for 02/01/2018 Arrival Information Details Patient Name: Shawn Colon, Shawn Colon Date of Service: 02/01/2018 1:45 PM Medical Record Number: [id number] Patient Account Number: [id number] Date of Birth/Sex: 12/12/1945 (73 y.o. M) Treating RN: Curtis Sites Primary Care Chene Kasinger: Wynn Banker Other Clinician: Referring Avyn Aden: Wynn Banker Treating Sumeet Geter/Extender: Linwood Dibbles, HOYT Weeks in Treatment: 2 Visit Information History Since Last Visit Added or deleted any medications: No Patient Arrived: Ambulatory Any new allergies or adverse reactions: No Arrival Time: 13:43 Had a fall or experienced change in No Accompanied By: wife activities of daily living that may affect Transfer Assistance: None risk of falls: Patient Identification Verified: Yes Signs or symptoms of abuse/neglect since last visito No Secondary Verification Process Yes Hospitalized since last visit: No Completed: Implantable device outside of the clinic excluding No Patient Has Alerts: Yes cellular tissue based products placed in the center Patient Alerts: Patient on Blood since last visit: Thinner Has Dressing in Place as Prescribed: Yes Warfarin Pain Present Now: Yes ABI [place] BILATERAL >220 Electronic Signature(s) Signed: 02/01/2018 4:33:45 PM By: Dayton Martes RCP, RRT, CHT Entered By: Dayton Martes on 02/01/2018 13:44:39 Shawn Colon ([id number]) -------------------------------------------------------------------------------- Clinic Level of Care Assessment Details Patient Name: Shawn Colon Date of Service: 02/01/2018 1:45 PM Medical Record Number: [id number] Patient Account Number: [id number] Date of Birth/Sex: November 22, 1945 (72 y.o. M) Treating RN: Curtis Sites Primary Care Sumaiya Arruda: Wynn Banker Other Clinician: Referring Sidnie Swalley: Wynn Banker Treating Dayne Chait/Extender: Linwood Dibbles, HOYT Weeks in Treatment: 2 Clinic Level of  Care Assessment Items TOOL 4 Quantity Score []  - Use when only an EandM is performed on FOLLOW-UP visit 0 ASSESSMENTS - Nursing Assessment / Reassessment X - Reassessment of Co-morbidities (includes updates in patient status) 1 10 X- 1 5 Reassessment of Adherence to Treatment Plan ASSESSMENTS - Wound and Skin Assessment / Reassessment X - Simple Wound Assessment / Reassessment - one wound 1 5 []  - 0 Complex Wound Assessment / Reassessment - multiple wounds []  - 0 Dermatologic / Skin Assessment (not related to wound area) ASSESSMENTS - Focused Assessment []  - Circumferential Edema Measurements - multi extremities 0 []  - 0 Nutritional Assessment / Counseling / Intervention X- 1 5 Lower Extremity Assessment (monofilament, tuning fork, pulses) []  - 0 Peripheral Arterial Disease Assessment (using hand held doppler) ASSESSMENTS - Ostomy and/or Continence Assessment and Care []  - Incontinence Assessment and Management 0 []  - 0 Ostomy Care Assessment and Management (repouching, etc.) PROCESS - Coordination of Care X - Simple Patient / Family Education for ongoing care 1 15 []  - 0 Complex (extensive) Patient / Family Education for ongoing care X- 1 10 Staff obtains Chiropractor, Records, Test Results / Process Orders []  - 0 Staff telephones HHA, Nursing Homes / Clarify orders / etc []  - 0 Routine Transfer to another Facility (non-emergent condition) []  - 0 Routine Hospital Admission (non-emergent condition) []  - 0 New Admissions / Manufacturing engineer / Ordering NPWT, Apligraf, etc. []  - 0 Emergency Hospital Admission (emergent condition) X- 1 10 Simple Discharge Coordination CLESTER, NOLE ([id number]) []  - 0 Complex (extensive) Discharge Coordination PROCESS - Special Needs []  - Pediatric / Minor Patient Management 0 []  - 0 Isolation Patient Management []  - 0 Hearing / Language / Visual special needs []  - 0 Assessment of Community assistance (transportation, D/C  planning, etc.) []  - 0 Additional assistance / Altered mentation []  - 0 Support Surface(s) Assessment (bed, cushion, seat, etc.) INTERVENTIONS - Wound Cleansing / Measurement X - Simple Wound Cleansing - one wound 1  5 []  - 0 Complex Wound Cleansing - multiple wounds X- 1 5 Wound Imaging (photographs - any number of wounds) []  - 0 Wound Tracing (instead of photographs) X- 1 5 Simple Wound Measurement - one wound []  - 0 Complex Wound Measurement - multiple wounds INTERVENTIONS - Wound Dressings X - Small Wound Dressing one or multiple wounds 1 10 []  - 0 Medium Wound Dressing one or multiple wounds []  - 0 Large Wound Dressing one or multiple wounds []  - 0 Application of Medications - topical []  - 0 Application of Medications - injection INTERVENTIONS - Miscellaneous []  - External ear exam 0 []  - 0 Specimen Collection (cultures, biopsies, blood, body fluids, etc.) []  - 0 Specimen(s) / Culture(s) sent or taken to Lab for analysis []  - 0 Patient Transfer (multiple staff / Nurse, adultHoyer Lift / Similar devices) []  - 0 Simple Staple / Suture removal (25 or less) []  - 0 Complex Staple / Suture removal (26 or more) []  - 0 Hypo / Hyperglycemic Management (close monitor of Blood Glucose) []  - 0 Ankle / Brachial Index (ABI) - do not check if billed separately X- 1 5 Vital Signs Colon, Shawn ([id number]) Has the patient been seen at the hospital within the last three years: Yes Total Score: 90 Level Of Care: New/Established - Level 3 Electronic Signature(s) Signed: 02/01/2018 5:03:48 PM By: Curtis Sitesorthy, Joanna Entered By: Curtis Sitesorthy, Joanna on 02/01/2018 14:32:48 Shawn Colon, Terik ([id number]) -------------------------------------------------------------------------------- Encounter Discharge Information Details Patient Name: Shawn Colon, Shawn Colon Date of Service: 02/01/2018 1:45 PM Medical Record Number: [id number] Patient Account Number: [id number] Date of Birth/Sex: 01-12-1946 [age] y.o.  M) Treating RN: Curtis Sitesorthy, Joanna Primary Care Haivyn Oravec: Wynn BankerAVIS, DEBRA Other Clinician: Referring Arion Shankles: Wynn BankerAVIS, DEBRA Treating Haldon Carley/Extender: Linwood DibblesSTONE III, HOYT Weeks in Treatment: 2 Encounter Discharge Information Items Discharge Condition: Stable Ambulatory Status: Ambulatory Discharge Destination: Home Transportation: Private Auto Accompanied By: spouse Schedule Follow-up Appointment: Yes Clinical Summary of Care: Electronic Signature(s) Signed: 02/01/2018 5:03:48 PM By: Curtis Sitesorthy, Joanna Entered By: Curtis Sitesorthy, Joanna on 02/01/2018 14:33:21 Shawn Colon, Tamarcus ([id number]) -------------------------------------------------------------------------------- Lower Extremity Assessment Details Patient Name: Shawn Colon, Shawn Colon Date of Service: 02/01/2018 1:45 PM Medical Record Number: [id number] Patient Account Number: [id number] Date of Birth/Sex: 01-12-1946 (72 y.o. M) Treating RN: Rema JasmineNg, Wendi Primary Care Gift Rueckert: Wynn BankerAVIS, DEBRA Other Clinician: Referring Devontae Casasola: Wynn BankerAVIS, DEBRA Treating Carlee Vonderhaar/Extender: STONE III, HOYT Weeks in Treatment: 2 Edema Assessment Assessed: [Left: No] [Right: No] [Left: Edema] [Right: :] Calf Left: Right: Point of Measurement: 31 cm From Medial Instep cm 32 cm Ankle Left: Right: Point of Measurement: 12 cm From Medial Instep cm 25 cm Vascular Assessment Claudication: Claudication Assessment [Right:None] Pulses: Dorsalis Pedis Palpable: [Right:Yes] Posterior Tibial Extremity colors, hair growth, and conditions: Extremity Color: [Right:Normal] Hair Growth on Extremity: [Right:No] Temperature of Extremity: [Right:Warm] Capillary Refill: [Right:< 3 seconds] Toe Nail Assessment Left: Right: Thick: Yes Discolored: Yes Deformed: No Improper Length and Hygiene: No Notes pt stated some toe nails removed a year ago. Electronic Signature(s) Signed: 02/01/2018 3:42:40 PM By: Rema JasmineNg, Wendi Entered By: Rema JasmineNg, Wendi on 02/01/2018 13:56:10 Shawn Colon, Shawn Colon  ([id number]) -------------------------------------------------------------------------------- Multi Wound Chart Details Patient Name: Shawn Colon, Shawn Colon Date of Service: 02/01/2018 1:45 PM Medical Record Number: [id number] Patient Account Number: [id number] Date of Birth/Sex: 01-12-1946 [age] y.o. M) Treating RN: Curtis Sitesorthy, Joanna Primary Care Kalik Hoare: Wynn BankerAVIS, DEBRA Other Clinician: Referring Season Astacio: Wynn BankerAVIS, DEBRA Treating Tracye Szuch/Extender: STONE III, HOYT Weeks in Treatment: 2 Vital Signs Height(in): 70 Pulse(bpm): 63 Weight(lbs): 156 Blood Pressure(mmHg): 131/45 Body Mass Index(BMI): 22 Temperature(F): 97.6 Respiratory Rate 18 (breaths/min): Photos: [N/A:N/A] Wound Location: Right  Calcaneus - Medial N/A N/A Wounding Event: Shear/Friction N/A N/A Primary Etiology: Pressure Ulcer N/A N/A Comorbid History: Hypertension, Phlebitis, N/A N/A Osteomyelitis, Neuropathy Date Acquired: 12/18/2017 N/A N/A Weeks of Treatment: 2 N/A N/A Wound Status: Open N/A N/A Measurements L x W x D 2.5x2.2x0.1 N/A N/A (cm) Area (cm) : 4.32 N/A N/A Volume (cm) : 0.432 N/A N/A % Reduction in Area: 7.40% N/A N/A % Reduction in Volume: 7.50% N/A N/A Classification: Category/Stage III N/A N/A Exudate Amount: Medium N/A N/A Exudate Type: Serous N/A N/A Exudate Color: amber N/A N/A Wound Margin: Thickened N/A N/A Granulation Amount: None Present (0%) N/A N/A Necrotic Amount: Large (67-100%) N/A N/A Necrotic Tissue: Eschar N/A N/A Exposed Structures: Fat Layer (Subcutaneous N/A N/A Tissue) Exposed: Yes Fascia: No Tendon: No Muscle: No Joint: No Bone: No Dentinger, Victorino ([id number]) Epithelialization: None N/A N/A Periwound Skin Texture: No Abnormalities Noted N/A N/A Periwound Skin Moisture: Maceration: No N/A N/A Dry/Scaly: No Periwound Skin Color: Erythema: Yes N/A N/A Erythema Location: Circumferential N/A N/A Temperature: No Abnormality N/A N/A Tenderness on Palpation: Yes N/A N/A Wound  Preparation: Ulcer Cleansing: N/A N/A Rinsed/Irrigated with Saline Topical Anesthetic Applied: Other: lidocaine 4% Treatment Notes Electronic Signature(s) Signed: 02/01/2018 5:03:48 PM By: Curtis Sites Entered By: Curtis Sites on 02/01/2018 14:31:34 Shawn Colon, Shawn Colon ([id number]) -------------------------------------------------------------------------------- Multi-Disciplinary Care Plan Details Patient Name: Shawn Colon Date of Service: 02/01/2018 1:45 PM Medical Record Number: [id number] Patient Account Number: [id number] Date of Birth/Sex: Dec 18, 1945 (72 y.o. M) Treating RN: Curtis Sites Primary Care Elbert Polyakov: Wynn Banker Other Clinician: Referring Felissa Blouch: Wynn Banker Treating Grabiela Wohlford/Extender: Linwood Dibbles, HOYT Weeks in Treatment: 2 Active Inactive Electronic Signature(s) Signed: 03/08/2018 2:27:22 PM By: Elliot Gurney, BSN, RN, CWS, Kim RN, BSN Signed: 03/21/2018 10:45:06 AM By: Curtis Sites Previous Signature: 02/01/2018 5:03:48 PM Version By: Curtis Sites Entered By: Elliot Gurney BSN, RN, CWS, Kim on 03/08/2018 14:27:21 Shawn Colon, Shawn Colon ([id number]) -------------------------------------------------------------------------------- Pain Assessment Details Patient Name: Shawn Colon Date of Service: 02/01/2018 1:45 PM Medical Record Number: [id number] Patient Account Number: [id number] Date of Birth/Sex: 03-17-1945 (73 y.o. M) Treating RN: Curtis Sites Primary Care Rad Gramling: Wynn Banker Other Clinician: Referring Phelix Fudala: Wynn Banker Treating Miaya Lafontant/Extender: STONE III, HOYT Weeks in Treatment: 2 Active Problems Location of Pain Severity and Description of Pain Patient Has Paino Yes Site Locations Rate the pain. Current Pain Level: 6 Pain Management and Medication Current Pain Management: Electronic Signature(s) Signed: 02/01/2018 4:33:45 PM By: Sallee Provencal, RRT, CHT Signed: 02/01/2018 5:03:48 PM By: Curtis Sites Entered By: Dayton Martes on 02/01/2018 13:44:48 Shawn Colon, Shawn Colon ([id number]) -------------------------------------------------------------------------------- Patient/Caregiver Education Details Patient Name: Shawn Colon Date of Service: 02/01/2018 1:45 PM Medical Record Number: [id number] Patient Account Number: [id number] Date of Birth/Gender: 1945-06-03 (73 y.o. M) Treating RN: Curtis Sites Primary Care Physician: Wynn Banker Other Clinician: Referring Physician: Wynn Banker Treating Physician/Extender: Skeet Simmer in Treatment: 2 Education Assessment Education Provided To: Patient and Caregiver Education Topics Provided Wound/Skin Impairment: Handouts: Other: wound care as ordered Methods: Demonstration, Explain/Verbal Responses: State content correctly Electronic Signature(s) Signed: 02/01/2018 5:03:48 PM By: Curtis Sites Entered By: Curtis Sites on 02/01/2018 14:33:38 Shawn Colon ([id number]) -------------------------------------------------------------------------------- Wound Assessment Details Patient Name: Shawn Colon Date of Service: 02/01/2018 1:45 PM Medical Record Number: [id number] Patient Account Number: [id number] Date of Birth/Sex: April 06, 1945 (73 y.o. M) Treating RN: Rema Jasmine Primary Care Delvonte Berenson: Wynn Banker Other Clinician: Referring Joy Haegele: Wynn Banker Treating Ashlen Kiger/Extender: STONE III, HOYT Weeks in Treatment: 2 Wound Status Wound Number: 1 Primary Pressure Ulcer Etiology: Wound Location: Right  Calcaneus - Medial Wound Status: Open Wounding Event: Shear/Friction Comorbid Hypertension, Phlebitis, Osteomyelitis, Date Acquired: 12/18/2017 History: Neuropathy Weeks Of Treatment: 2 Clustered Wound: No Photos Photo Uploaded By: Rema JasmineNg, Wendi on 02/01/2018 14:01:52 Wound Measurements Length: (cm) 2.5 % Reduction Width: (cm) 2.2 % Reduction Depth: (cm) 0.1 Epitheliali Area: (cm) 4.32 Tunneling: Volume: (cm) 0.432  Underminin in Area: 7.4% in Volume: 7.5% zation: None No g: No Wound Description Classification: Category/Stage III Foul Odor A Wound Margin: Thickened Slough/Fibr Exudate Amount: Medium Exudate Type: Serous Exudate Color: amber fter Cleansing: No ino Yes Wound Bed Granulation Amount: None Present (0%) Exposed Structure Necrotic Amount: Large (67-100%) Fascia Exposed: No Necrotic Quality: Eschar Fat Layer (Subcutaneous Tissue) Exposed: Yes Tendon Exposed: No Muscle Exposed: No Joint Exposed: No Bone Exposed: No Periwound Skin Texture Mainer, Long ([id number]) Texture Color No Abnormalities Noted: No No Abnormalities Noted: No Erythema: Yes Moisture Erythema Location: Circumferential No Abnormalities Noted: No Dry / Scaly: No Temperature / Pain Maceration: No Temperature: No Abnormality Tenderness on Palpation: Yes Wound Preparation Ulcer Cleansing: Rinsed/Irrigated with Saline Topical Anesthetic Applied: Other: lidocaine 4%, Electronic Signature(s) Signed: 02/01/2018 3:42:40 PM By: Rema JasmineNg, Wendi Entered By: Rema JasmineNg, Wendi on 02/01/2018 13:54:27 Shawn Colon, Krishan ([id number]) -------------------------------------------------------------------------------- Vitals Details Patient Name: Shawn Colon, Asencion Date of Service: 02/01/2018 1:45 PM Medical Record Number: [id number] Patient Account Number: [id number] Date of Birth/Sex: 06-26-1945 [age] y.o. M) Treating RN: Curtis Sitesorthy, Joanna Primary Care Dontrelle Mazon: Wynn BankerAVIS, DEBRA Other Clinician: Referring Payeton Germani: Wynn BankerAVIS, DEBRA Treating Mandel Seiden/Extender: STONE III, HOYT Weeks in Treatment: 2 Vital Signs Time Taken: 13:44 Temperature (F): 97.6 Height (in): 70 Pulse (bpm): 63 Weight (lbs): 156 Respiratory Rate (breaths/min): 18 Body Mass Index (BMI): 22.4 Blood Pressure (mmHg): 131/45 Reference Range: 80 - 120 mg / dl Electronic Signature(s) Signed: 02/01/2018 4:33:45 PM By: Dayton MartesWallace, RCP,RRT,CHT, Sallie RCP, RRT, CHT Entered By:  Dayton MartesWallace, RCP,RRT,CHT, Sallie on 02/01/2018 13:47:07

## 2018-02-03 NOTE — Progress Notes (Signed)
Shawn Colon, Shawn Colon ([id number]) Visit Report for 02/01/2018 Chief Complaint Document Details Patient Name: Shawn Colon, Shawn Colon Date of Service: 02/01/2018 1:45 PM Medical Record Number: [id number] Patient Account Number: [id number] Date of Birth/Sex: 24-May-1945 (73 y.o. M) Treating RN: Shawn Colon Primary Care Provider: Wynn Colon Other Clinician: Referring Provider: Wynn Colon Treating Provider/Extender: Shawn Colon, Shawn Colon: 2 Information Obtained from: Patient Chief Complaint Right heel ulcer Electronic Signature(s) Signed: 02/03/2018 9:23:56 AM By: Lenda Kelp PA-C Entered By: Lenda Kelp on 02/01/2018 13:38:34 Shawn Colon ([id number]) -------------------------------------------------------------------------------- HPI Details Patient Name: Shawn Colon Date of Service: 02/01/2018 1:45 PM Medical Record Number: [id number] Patient Account Number: [id number] Date of Birth/Sex: Apr 26, 1945 (73 y.o. M) Treating RN: Shawn Colon Primary Care Provider: Wynn Colon Other Clinician: Referring Provider: Wynn Colon Treating Provider/Extender: Shawn Colon, Shawn Colon: 2 History of Present Illness HPI Description: 01/18/18 on evaluation today patient presents for evaluation concerning issues that have been present for roughly 3 weeks after the patient sustained an injury to his shoulder due to a fall. He was in the bed for about three weeks. Of time and states that during that time this ulcer appeared. It sounds like it may have been a deep tissue injury initially and the appearance seems to lend itself to that as well. Fortunately there is no signs of infection at this time. No fevers, chills, nausea, or vomiting noted at this time. He does not have a history of diabetes although he states that he has been told it is "borderline". He does have a history of hypertension and is on blood thinners. With that being said I see no evidence of this point of any  worsening in fact this seems to be healing. His ABI's were noncompressible but again he seems to have a warm foot with good capillary refill and again the wound seems to be healing well already I'm gonna hold off on ordering arterial studies at this point. 01/25/18 on evaluation today patient appears to be doing somewhat poorly in regard to his heel ulcer. They have been utilizing the Prisma unfortunately there does not seem to be a lot of improvement since last visit. Nonetheless I think that we may need to consider a referral to vascular for further evaluation of his arterial status. His ABI's were noncompressible. 02/01/18 on evaluation today patient appears to be doing about the same in regard to his heel ulcer. He's been tolerating the dressing changes without complication. He did have his arterial study which showed that he had ABI's did appear to be 0.7 on the right and noncompressible on the left. This is suggested by the radiologist to indicate at least moderate peripheral vascular disease. Nonetheless with the heel ulcer not feeling in the manner that it is I'm recommending that we likely get him to see a vascular surgeon for further evaluation of this region. He and his wife are in agreement with the plan. Electronic Signature(s) Signed: 02/03/2018 9:23:56 AM By: Lenda Kelp PA-C Entered By: Lenda Kelp on 02/01/2018 14:35:33 Shawn Colon ([id number]) -------------------------------------------------------------------------------- Physical Exam Details Patient Name: Shawn Colon Date of Service: 02/01/2018 1:45 PM Medical Record Number: [id number] Patient Account Number: [id number] Date of Birth/Sex: 05-15-1945 (73 y.o. M) Treating RN: Shawn Colon Primary Care Provider: Wynn Colon Other Clinician: Referring Provider: Wynn Colon Treating Provider/Extender: Shawn III, Shawn Colon: 2 Constitutional Well-nourished and well-hydrated in no acute  distress. Respiratory normal breathing without difficulty. clear to auscultation bilaterally. Cardiovascular regular rate and rhythm with  normal S1, S2. Psychiatric this patient is able to make decisions and demonstrates good insight into disease process. Alert and Oriented x 3. pleasant and cooperative. Notes On evaluation today patient's wound bed does still have eschar covering the surface of the wound although it does appear to be getting a little bit more soft I think that there is still some ways to go before this is going to loosen up enough. Sharp debridement could be attempted however my concern at this point is that he truly is having a lot of pain and with the findings of his ABI being somewhat low at 0.7 on the right I think that we would not want to proceed with any sharp debridement at this point until he can get better blood flow established. He and his wife are in agreement with that plan. Electronic Signature(s) Signed: 02/03/2018 9:23:56 AM By: Lenda Kelp PA-C Entered By: Lenda Kelp on 02/01/2018 14:36:16 Shawn Colon ([id number]) -------------------------------------------------------------------------------- Physician Orders Details Patient Name: Shawn Colon Date of Service: 02/01/2018 1:45 PM Medical Record Number: [id number] Patient Account Number: [id number] Date of Birth/Sex: [birth year] (73 y.o. 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 o May Shower, gently pat wound dry prior to applying new dressing. -  Please wash wound with Dial antibacterial soap in the shower Anesthetic (add to Medication List) Wound #1 Right,Medial Calcaneus o Topical Lidocaine 4% cream applied to wound bed prior to debridement (In Clinic Only). Primary Wound Dressing Wound #1 Right,Medial Calcaneus o Iodoflex Secondary Dressing Wound #1 Right,Medial Calcaneus o Boardered Foam Dressing - secure with netting Dressing Change Frequency Wound #1 Right,Medial Calcaneus o Change dressing every other day. Follow-up Appointments Wound #1 Right,Medial Calcaneus o Return Appointment in 1 week. Off-Loading Wound #1 Right,Medial Calcaneus o Other: - keep pressure off your heel as much as you can Consults o Vascular - through the [facility] NUMA, SCHROETER ([id number]) Electronic Signature(s) Signed: 02/01/2018 5:03:48 PM By: Shawn Colon Signed: 02/03/2018 9:23:56 AM By: Lenda Kelp PA-C Entered By: Shawn Colon on 02/01/2018 14:32:19 BREES, HOUNSHELL ([id number]) -------------------------------------------------------------------------------- Problem List Details Patient Name: Shawn Colon Date of Service: 02/01/2018 1:45 PM Medical Record Number: [id number] Patient Account Number: [id number] Date of Birth/Sex: May 14, 1945 (73 y.o. 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 M) Treating RN: Shawn Colon Primary Care Provider: Wynn Colon Other Clinician: Referring Provider: Wynn Colon Treating Provider/Extender: Shawn Colon, Shawn Colon: 2 Subjective Chief Complaint Information obtained from Patient Right heel ulcer History of Present Illness (HPI) 01/18/18 on  evaluation today patient presents for evaluation concerning issues that have been present for roughly 3 weeks after the patient sustained an injury to his shoulder due to a fall. He was in the bed for about three weeks. Of time and states that during that time this ulcer appeared. It sounds like it may have been a deep tissue injury initially and the appearance seems to lend itself to that as well. Fortunately there is no signs of infection at this time. No fevers, chills, nausea, or vomiting noted at this time. He does not have a history of diabetes although he states that he has been told it is "borderline". He does have a history of hypertension and is on blood thinners. With that being said I see no evidence of this point of any worsening in fact this seems to be healing. His ABI's were noncompressible but again he seems to have a warm foot with good capillary refill and again the wound seems to be healing well already I'm gonna hold off on ordering arterial studies at this point. 01/25/18 on evaluation today patient appears to be doing somewhat poorly in regard to his heel ulcer. They have been utilizing the Prisma unfortunately there does not seem to be a lot of improvement since last visit. Nonetheless I think that we may need to consider a referral to vascular for further evaluation of his arterial  status. His ABI's were noncompressible. 02/01/18 on evaluation today patient appears to be doing about the same in regard to his heel ulcer. He's been tolerating the dressing changes without complication. He did have his arterial study which showed that he had ABI's did appear to be 0.7 on the right and noncompressible on the left. This is suggested by the radiologist to indicate at least moderate peripheral vascular disease. Nonetheless with the heel ulcer not feeling in the manner that it is I'm recommending that we likely get him to see a vascular surgeon for further evaluation of this region. He and his wife are in agreement with the plan. Patient History Information obtained from Patient. Family History Cancer - Father, No family history of Diabetes, Heart Disease, Hereditary Spherocytosis, Hypertension, Kidney Disease, Lung Disease, Seizures, Stroke, Thyroid Problems, Tuberculosis. Social History Former smoker - 53 years - ended on 01/13/2008, Marital Status - Married, Alcohol Use - Never, Drug Use - No History, Caffeine Use - Shawn Colon - coffee. Medical History Hospitalization/Surgery History [place], [place], Left heel. Medical And Surgical History Notes Respiratory [facility] Genitourinary Renal agenesis Shawn Colon, Shawn Colon ([id number]) Review of Systems (ROS) Constitutional Symptoms (General Health) Denies complaints or symptoms of Fever, Chills. Respiratory The patient has no complaints or symptoms. Cardiovascular The patient has no complaints or symptoms. Psychiatric The patient has no complaints or symptoms. Objective Constitutional Well-nourished and well-hydrated in no acute distress. Vitals Time Taken: 1:44 PM, Height: 70 in, Weight: 156 lbs, BMI: 22.4, Temperature: 97.6 F, Pulse: 63 bpm, Respiratory Rate: 18 breaths/min, Blood Pressure: 131/45 mmHg. Respiratory normal breathing without difficulty. clear to auscultation bilaterally. Cardiovascular regular rate and  rhythm with normal S1, S2. Psychiatric this patient is able to make decisions and demonstrates good insight into disease process. Alert and Oriented x 3. pleasant and cooperative. General Notes: On evaluation today patient's wound bed does still have eschar covering the surface of the wound although it does appear to be getting a little bit more soft I think that there is still some ways to go before this is going to loosen up enough. Sharp debridement could  be attempted however my concern at this point is that he truly is having a lot of pain and with the findings of his ABI being somewhat low at 0.7 on the right I think that we would not want to proceed with any sharp debridement at this point until he can get better blood flow established. He and his wife are in agreement with that plan. Integumentary (Hair, Skin) Wound #1 status is Open. Original cause of wound was Shear/Friction. The wound is located on the Right,Medial Calcaneus. The wound measures 2.5cm length x 2.2cm width x 0.1cm depth; 4.32cm^2 area and 0.432cm^3 volume. There is Fat Layer (Subcutaneous Tissue) Exposed exposed. There is no tunneling or undermining noted. There is a medium amount of serous drainage noted. The wound margin is thickened. There is no granulation within the wound bed. There is a large (67-100%) amount of necrotic tissue within the wound bed including Eschar. The periwound skin appearance exhibited: Erythema. The periwound skin appearance did not exhibit: Dry/Scaly, Maceration. The surrounding wound skin color is noted with erythema which is circumferential. Periwound temperature was noted as No Abnormality. The periwound has tenderness on palpation. 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 May Shower, gently pat wound dry prior to applying new dressing. - Please wash wound with Dial antibacterial soap in the shower Anesthetic (add to Medication List): Wound #1 Right,Medial Calcaneus: Topical Lidocaine 4% cream applied to wound bed prior to debridement (In Clinic Only). Primary Wound Dressing: Wound #1 Right,Medial Calcaneus: Iodoflex Secondary Dressing: Wound #1 Right,Medial Calcaneus: Boardered Foam Dressing - secure with netting Dressing Change Frequency: Wound #1 Right,Medial Calcaneus: Change dressing every other day. Follow-up Appointments: Wound #1 Right,Medial Calcaneus: Return Appointment in 1 week. Off-Loading: Wound #1 Right,Medial Calcaneus: Other: - keep pressure off your heel as much as you can Consults ordered were: [facility] I'm in a recommend that we see him back for reevaluation in one weeks time to see were things stand. In the meantime work on trying to get him scheduled for vascular [place] area within the [facility] if it all possible. Worst case scenario he would like to be [facility] at [facility] for [facility] evaluation. I am okay with that as well. Will otherwise see were things stand at follow-up. Please see above for specific wound care orders. We will see patient for re-evaluation in 1 week(s) here in the clinic. If anything worsens or changes patient will contact our office for additional recommendations. Electronic Signature(s) Signed: 02/03/2018 9:23:56 AM By: Lenda KelpStone III, Hoyt PA-C Entered By: Lenda KelpStone III, Colon on 02/01/2018 14:36:48 Shawn Colon, Shawn Colon ([id number]) Shawn Colon, Shawn Colon ([id number]) -------------------------------------------------------------------------------- ROS/PFSH Details Patient Name: Shawn Colon, Shawn Colon Date of Service: 02/01/2018 1:45 PM Medical Record Number: [id number] Patient Account Number: [id number] Date of Birth/Sex: 1945/04/26 38(72 y.o.  M) Treating RN: Shawn Sitesorthy, Joanna Primary Care Provider: Wynn BankerAVIS, DEBRA Other Clinician: Referring Provider: Wynn BankerAVIS, DEBRA Treating Provider/Extender: Shawn III, Shawn Colon: 2 Information Obtained From Patient Wound History Do you currently have one or more open woundso Yes How many open wounds do you currently haveo 1 Approximately how long have you had your woundso 1 month How have you been treating your wound(s) until nowo clotrimazole cream, oral antibiotics Has your wound(s) ever healed and then re-openedo No Have you had any lab work  done in the past montho No Have you tested positive for an antibiotic resistant organism (MRSA, VRE)o No Have you tested positive for osteomyelitis (bone infection)o No Have you had any tests for circulation on your legso No Have you had other problems associated with your woundso Swelling Constitutional Symptoms (General Health) Complaints and Symptoms: Negative for: Fever; Chills Respiratory Complaints and Symptoms: No Complaints or Symptoms Medical History: Past Medical History Notes: [facility] Cardiovascular Complaints and Symptoms: No Complaints or Symptoms Medical History: Positive for: Hypertension; Phlebitis - 1999 Genitourinary Medical History: Past Medical History Notes: Renal agenesis Integumentary (Skin) Medical History: Negative for: History of Burn; History of pressure wounds Musculoskeletal BENHAMIN, DAMBOISE ([id number]) Medical History: 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 Neurologic Medical History: Positive for: Neuropathy - 1997 Oncologic Medical History: Negative for: Received Chemotherapy; Received Radiation Psychiatric Complaints and Symptoms: No Complaints or Symptoms Immunizations Pneumococcal Vaccine: Received Pneumococcal Vaccination: Yes Implantable Devices Hospitalization / Surgery History Name of Hospital Purpose of Hospitalization/Surgery Date [place] Left heel 01/12/2009 Family and  Social History Cancer: Yes - Father; Diabetes: No; Heart Disease: No; Hereditary Spherocytosis: No; Hypertension: No; Kidney Disease: No; Lung Disease: No; Seizures: No; Stroke: No; Thyroid Problems: No; Tuberculosis: No; Former smoker - 53 years - ended on 01/13/2008; Marital Status - Married; Alcohol Use: Never; Drug Use: No History; Caffeine Use: Shawn Colon - coffee; Financial Concerns: No; Food, Clothing or Shelter Needs: No; Support System Lacking: No; Transportation Concerns: No; Advanced Directives: No; Patient does not want information on Advanced Directives; Living Will: Yes (Not Provided) Physician Affirmation I have reviewed and agree with the above information. Electronic Signature(s) Signed: 02/01/2018 5:03:48 PM By: Shawn Colon Signed: 02/03/2018 9:23:56 AM By: Lenda Kelp PA-C Entered By: Lenda Kelp on 02/01/2018 14:35:54 HASTINGS, BUERKLE ([id number]) -------------------------------------------------------------------------------- SuperBill Details Patient Name: Shawn Colon Date of Service: 02/01/2018 Medical Record Number: [id number] Patient Account Number: [id number] Date of Birth/Sex: 1945/01/20 (73 y.o. 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

## 2018-02-08 ENCOUNTER — Ambulatory Visit: Payer: Medicare Other | Admitting: Physician Assistant

## 2019-06-11 IMAGING — CR DG SHOULDER 2+V*R*
1 series · 3 of 3 positions shown · non-contrast
Comparison: None.

CLINICAL DATA: Right shoulder pain after fall today.

EXAM:
RIGHT SHOULDER - 2+ VIEW

[Series 1: dg shoulder right · 0.14mm/px · 3 of 3 slices shown]
[im 1/3]
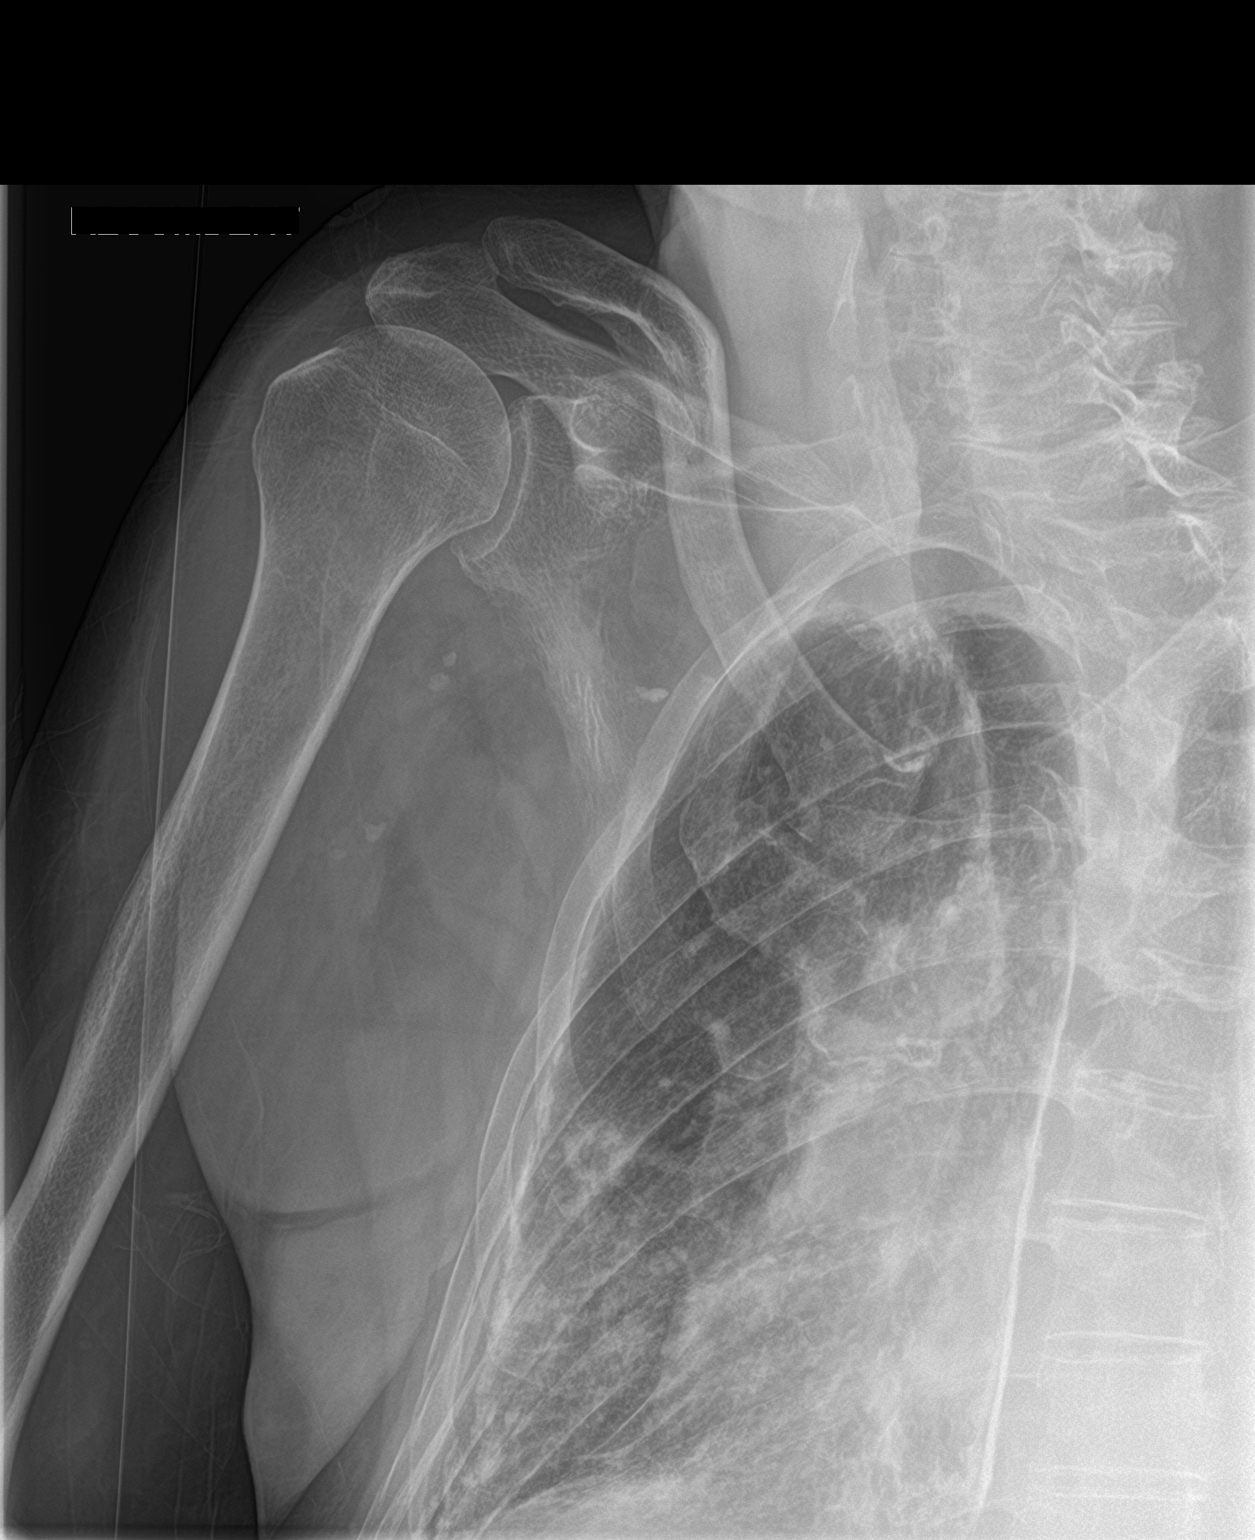
[im 2/3]
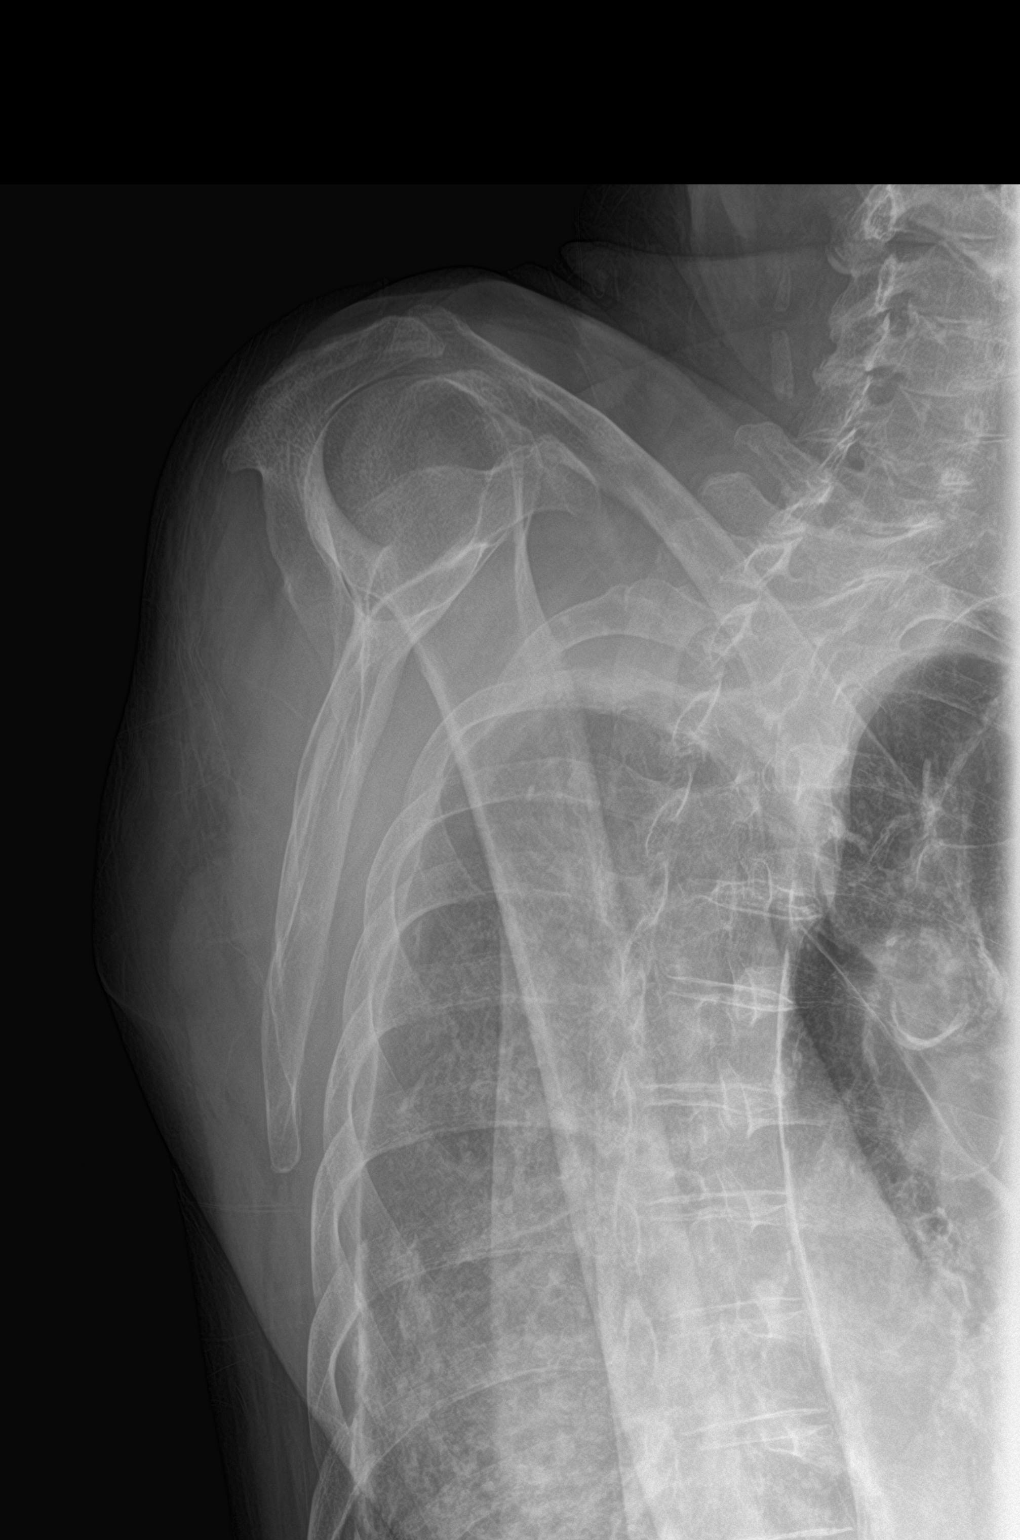
[im 3/3]
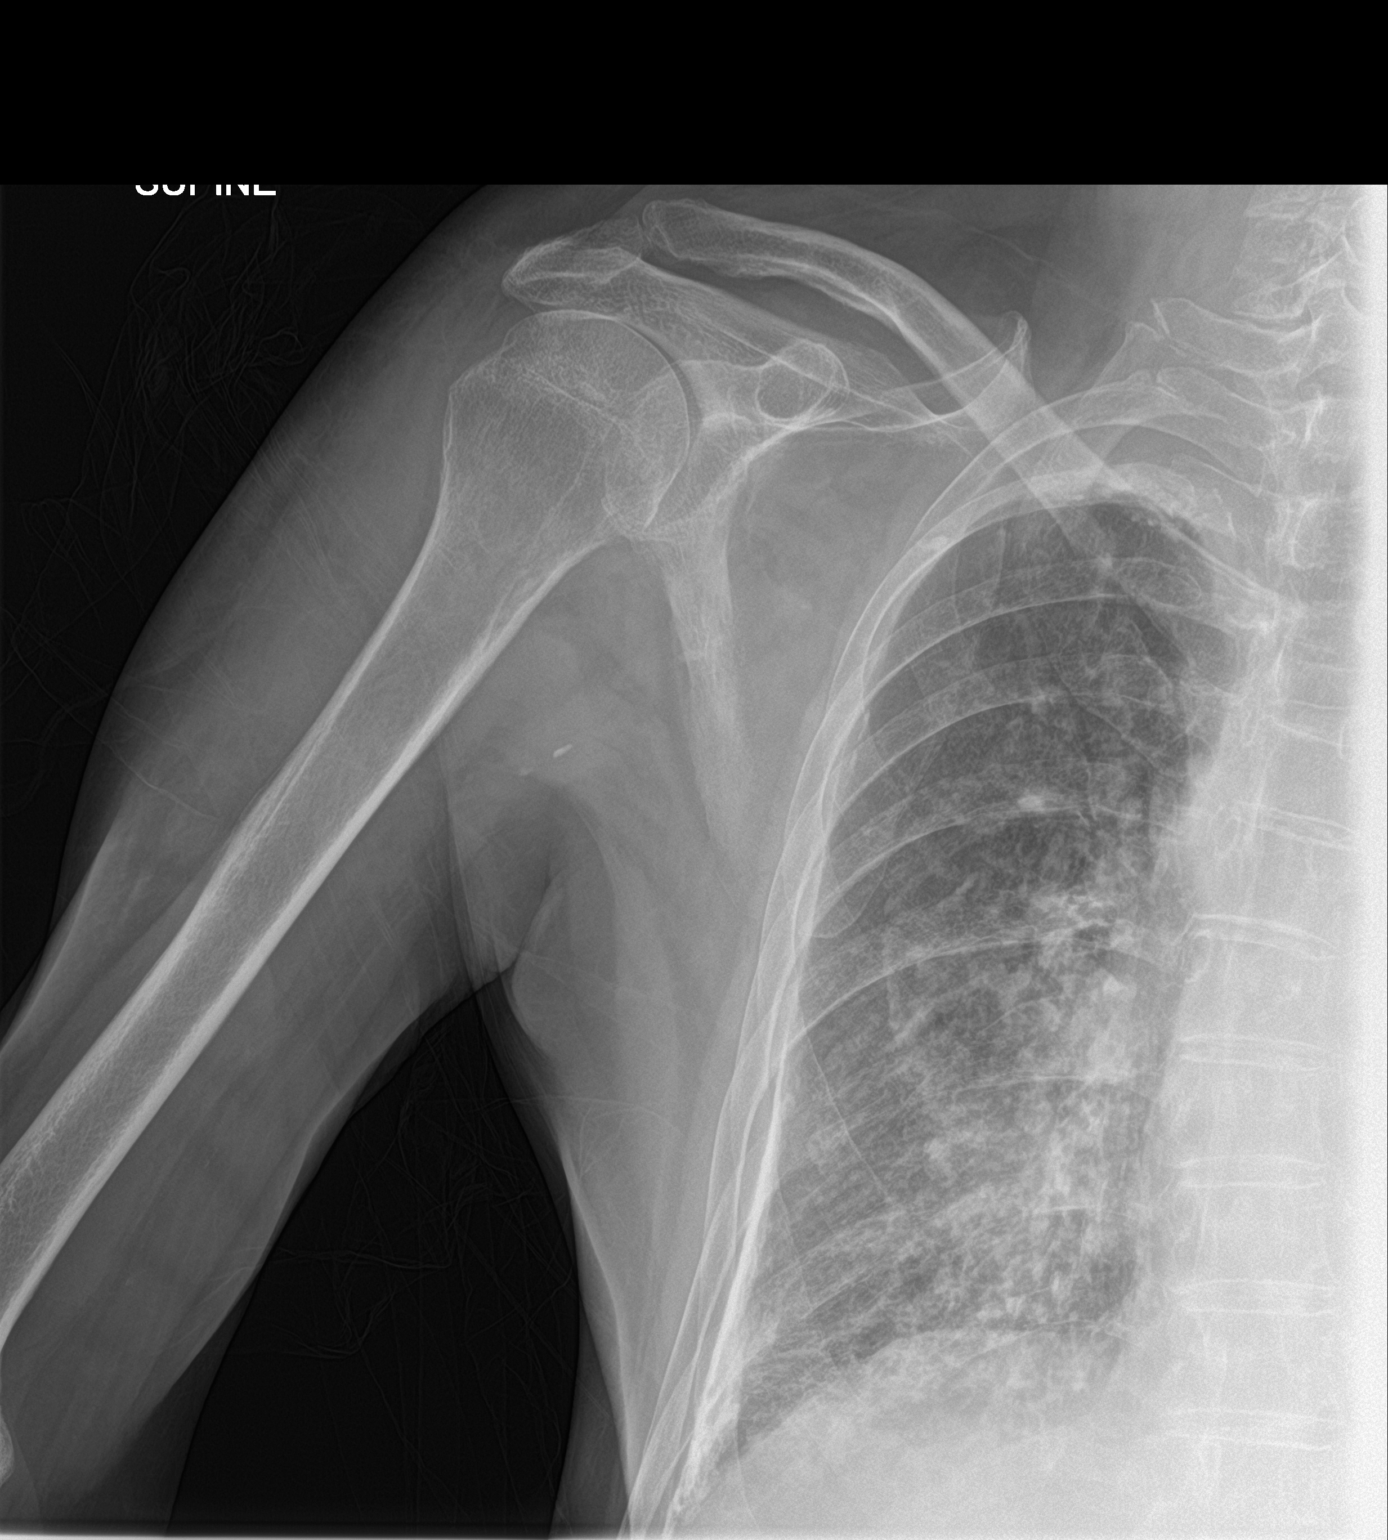

[3 of 3 positions shown; findings below may reference images not displayed]

FINDINGS: There is no evidence of fracture or dislocation. There is no
evidence of arthropathy or other focal bone abnormality. Soft
tissues are unremarkable.
IMPRESSION: Negative.

## 2019-11-14 ENCOUNTER — Emergency Department: Admit: 2019-11-15 | Payer: MEDICARE | Primary: Family Medicine

## 2019-11-14 DIAGNOSIS — I82403 Acute embolism and thrombosis of unspecified deep veins of lower extremity, bilateral: Principal | ICD-10-CM
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
## 2019-11-14 NOTE — ED Notes (Signed)
Bed: ED-04  Expected date:   Expected time:   Means of arrival:   Comments:  medic     Thurnell Garbe, RN  11/14/19 [id number]

## 2019-11-15 ENCOUNTER — Inpatient Hospital Stay: Admit: 2019-11-15 | Payer: MEDICARE | Primary: Family Medicine

## 2019-11-15 ENCOUNTER — Encounter: Primary: Family Medicine

## 2019-11-15 ENCOUNTER — Emergency Department: Admit: 2019-11-15 | Payer: MEDICARE | Primary: Family Medicine

## 2019-11-15 ENCOUNTER — Inpatient Hospital Stay
Admission: EM | Admit: 2019-11-15 | Discharge: 2019-11-17 | Disposition: A | Discharge status: Home / Self Care | Payer: Medicare Other | Admitting: Internal Medicine

## 2019-11-15 LAB — LIPASE: Lipase: 7 IU/L — ABNORMAL LOW (ref 13–60)

## 2019-11-15 LAB — COMPREHENSIVE METABOLIC PANEL
ALT: 14 U/L (ref 10–40)
AST: 21 IU/L (ref 15–37)
Albumin: 3.8 GM/DL (ref 3.4–5.0)
Alkaline Phosphatase: 54 IU/L (ref 40–128)
Anion Gap: 11 (ref 4–16)
BUN: 11 MG/DL (ref 6–23)
CO2: 29 MMOL/L (ref 21–32)
Calcium: 8.9 MG/DL (ref 8.3–10.6)
Chloride: 103 mMol/L (ref 99–110)
Creatinine: 0.9 MG/DL (ref 0.9–1.3)
GFR African American: >60 mL/min/{1.73_m2} (ref >60)
GFR Non-African American: >60 mL/min/{1.73_m2} (ref >60)
Glucose: 98 MG/DL (ref 70–99)
Potassium: 4 MMOL/L (ref 3.5–5.1)
Sodium: 143 MMOL/L (ref 135–145)
Total Bilirubin: 0.6 MG/DL (ref 0.0–1.0)
Total Protein: 6.6 GM/DL (ref 6.4–8.2)

## 2019-11-15 LAB — CBC WITH AUTO DIFFERENTIAL
Basophils %: 0.4 % (ref 0–1)
Basophils Absolute: 0 10*3/uL
Eosinophils %: 0.9 % (ref 0–3)
Eosinophils Absolute: 0.1 10*3/uL
Hematocrit: 41.2 % — ABNORMAL LOW (ref 42–52)
Hemoglobin: 13.2 GM/DL — ABNORMAL LOW (ref 13.5–18.0)
Immature Neutrophil %: 0.3 % (ref 0–0.43)
Lymphocytes %: 12.8 % — ABNORMAL LOW (ref 24–44)
Lymphocytes Absolute: 0.9 10*3/uL
MCH: 29.1 PG (ref 27–31)
MCHC: 32 % (ref 32.0–36.0)
MCV: 90.9 FL (ref 78–100)
MPV: 8.8 FL (ref 7.5–11.1)
Monocytes %: 8.3 % — ABNORMAL HIGH (ref 0–4)
Monocytes Absolute: 0.6 10*3/uL
Nucleated RBC %: 0 %
Platelets: 202 10*3/uL (ref 140–440)
RBC: 4.53 10*6/uL — ABNORMAL LOW (ref 4.6–6.2)
RDW: 14.3 % (ref 11.7–14.9)
Segs Absolute: 5.4 10*3/uL
Segs Relative: 77.3 % — ABNORMAL HIGH (ref 36–66)
Total Immature Neutrophil: 0.02 10*3/uL
Total Nucleated RBC: 0 10*3/uL
WBC: 7 10*3/uL (ref 4.0–10.5)

## 2019-11-15 LAB — PROTIME/INR & PTT
INR: 1.02 INDEX
Protime: 13.2 SECONDS (ref 11.7–14.5)
aPTT: 33.1 SECONDS (ref 25.1–37.1)

## 2019-11-15 LAB — URINALYSIS
Bacteria, UA: NEGATIVE /HPF
Bilirubin Urine: NEGATIVE MG/DL
Blood, Urine: NEGATIVE
Glucose, Urine: NEGATIVE MG/DL
Leukocyte Esterase, Urine: NEGATIVE
Nitrite Urine, Quantitative: NEGATIVE
Protein, UA: NEGATIVE MG/DL
RBC, UA: NONE SEEN /HPF (ref 0–3)
Renal Epithelial, UA: <1 /HPF
Specific Gravity, UA: 1.015 (ref 1.001–1.035)
Squam Epithel, UA: <1 /HPF
Trichomonas, UA: NONE SEEN /HPF
Urobilinogen, Urine: NEGATIVE MG/DL (ref 0.2–1.0)
WBC, UA: <1 /HPF (ref 0–2)
pH, Urine: 6 (ref 5.0–8.0)

## 2019-11-15 LAB — CK: Total CK: 88 IU/L (ref 38–174)

## 2019-11-15 LAB — COVID-19, RAPID: SARS-CoV-2, NAAT: NOT DETECTED

## 2019-11-15 LAB — BRAIN NATRIURETIC PEPTIDE: Pro-BNP: 1831 PG/ML — ABNORMAL HIGH (ref <300)

## 2019-11-15 LAB — TROPONIN: Troponin T: <0.01 NG/ML (ref <0.01)

## 2019-11-15 LAB — ECHOCARDIOGRAM COMPLETE 2D W DOPPLER W COLOR: Left Ventricular Ejection Fraction: 60

## 2019-11-15 LAB — LACTIC ACID: Lactate: 1.7 mMOL/L (ref 0.4–2.0)

## 2019-11-15 MED ORDER — OXYCODONE-ACETAMINOPHEN 5-325 MG PO TABS
5-325 MG | Freq: Four times a day (QID) | ORAL | Status: DC | PRN
Start: 2019-11-15 — End: 2019-11-17
  Administered 2019-11-15 – 2019-11-17 (×7): 1 via ORAL

## 2019-11-15 MED ORDER — ENOXAPARIN SODIUM 40 MG/0.4ML SC SOLN
400.4 MG/0.4ML | Freq: Once | SUBCUTANEOUS | Status: DC
Start: 2019-11-15 — End: 2019-11-17

## 2019-11-15 MED ORDER — ONDANSETRON HCL 4 MG/2ML IJ SOLN
4 | Freq: Four times a day (QID) | INTRAMUSCULAR | Status: DC | PRN
Start: 2019-11-15 — End: 2019-11-17

## 2019-11-15 MED ORDER — NORMAL SALINE FLUSH 0.9 % IV SOLN
0.9 | Freq: Two times a day (BID) | INTRAVENOUS | Status: DC
Start: 2019-11-15 — End: 2019-11-17

## 2019-11-15 MED ORDER — SODIUM CHLORIDE 0.9 % IV BOLUS
0.9 % | Freq: Once | INTRAVENOUS | Status: AC
Start: 2019-11-15 — End: 2019-11-15
  Administered 2019-11-15: 05:00:00 500 mL via INTRAVENOUS

## 2019-11-15 MED ORDER — ENOXAPARIN SODIUM 40 MG/0.4ML SC SOLN
400.4 MG/0.4ML | Freq: Every day | SUBCUTANEOUS | Status: DC
Start: 2019-11-15 — End: 2019-11-15
  Administered 2019-11-15: 15:00:00 40 mg via SUBCUTANEOUS

## 2019-11-15 MED ORDER — ACETAMINOPHEN 325 MG PO TABS
325 | Freq: Four times a day (QID) | ORAL | Status: DC | PRN
Start: 2019-11-15 — End: 2019-11-17
  Administered 2019-11-15: 10:00:00 650 mg via ORAL

## 2019-11-15 MED ORDER — IOPAMIDOL 76 % IV SOLN
76 % | Freq: Once | INTRAVENOUS | Status: AC | PRN
Start: 2019-11-15 — End: 2019-11-15
  Administered 2019-11-15: 06:00:00 75 mL via INTRAVENOUS

## 2019-11-15 MED ORDER — TETANUS-DIPHTH-ACELL PERTUSSIS 5-2.5-18.5 LF-MCG/0.5 IM SUSY
Freq: Once | INTRAMUSCULAR | Status: AC
Start: 2019-11-15 — End: 2019-11-15
  Administered 2019-11-15: 07:00:00 0.5 mL via INTRAMUSCULAR

## 2019-11-15 MED ORDER — ONDANSETRON HCL 4 MG/2ML IJ SOLN
4 MG/2ML | Freq: Once | INTRAMUSCULAR | Status: AC
Start: 2019-11-15 — End: 2019-11-15
  Administered 2019-11-15: 05:00:00 4 mg via INTRAVENOUS

## 2019-11-15 MED ORDER — IOPAMIDOL 76 % IV SOLN
76 % | Freq: Once | INTRAVENOUS | Status: AC | PRN
Start: 2019-11-15 — End: 2019-11-15
  Administered 2019-11-15: 22:00:00 75 mL via INTRAVENOUS

## 2019-11-15 MED ORDER — ENOXAPARIN SODIUM 80 MG/0.8ML SC SOLN
80 MG/0.8ML | Freq: Two times a day (BID) | SUBCUTANEOUS | Status: DC
Start: 2019-11-15 — End: 2019-11-17
  Administered 2019-11-16 (×2): 80 mg/kg via SUBCUTANEOUS

## 2019-11-15 MED ORDER — ONDANSETRON 4 MG PO TBDP
4 | Freq: Three times a day (TID) | ORAL | Status: DC | PRN
Start: 2019-11-15 — End: 2019-11-17
  Administered 2019-11-16: 15:00:00 4 mg via ORAL

## 2019-11-15 MED ORDER — SODIUM CHLORIDE 0.9 % IV SOLN
0.9 % | INTRAVENOUS | Status: DC | PRN
Start: 2019-11-15 — End: 2019-11-17

## 2019-11-15 MED ORDER — NORMAL SALINE FLUSH 0.9 % IV SOLN
0.9 | INTRAVENOUS | Status: DC | PRN
Start: 2019-11-15 — End: 2019-11-17

## 2019-11-15 MED ORDER — ACETAMINOPHEN 650 MG RE SUPP
650 | Freq: Four times a day (QID) | RECTAL | Status: DC | PRN
Start: 2019-11-15 — End: 2019-11-17

## 2019-11-15 MED ORDER — LIDOCAINE 4 % EX PTCH
4 % | Freq: Once | CUTANEOUS | Status: AC
Start: 2019-11-15 — End: 2019-11-15
  Administered 2019-11-15: 07:00:00 1 via TOPICAL

## 2019-11-15 MED ORDER — BISACODYL 5 MG PO TBEC
5 | Freq: Every day | ORAL | Status: DC | PRN
Start: 2019-11-15 — End: 2019-11-17

## 2019-11-15 MED FILL — LOVENOX 40 MG/0.4ML SC SOLN: 40 MG/0.4ML | SUBCUTANEOUS | Qty: 0.4

## 2019-11-15 MED FILL — OXYCODONE-ACETAMINOPHEN 5-325 MG PO TABS: 5-325 mg | ORAL | Qty: 1

## 2019-11-15 MED FILL — ONDANSETRON HCL 4 MG/2ML IJ SOLN: 4 MG/2ML | INTRAMUSCULAR | Qty: 2

## 2019-11-15 MED FILL — BOOSTRIX 5-2.5-18.5 LF-MCG/0.5 IM SUSY: 5-2.5-18.5 LF-MCG/0.5 | INTRAMUSCULAR | Qty: 0.5

## 2019-11-15 MED FILL — NORMAL SALINE FLUSH 0.9 % IV SOLN: 0.9 % | INTRAVENOUS | Qty: 10

## 2019-11-15 MED FILL — PAIN & FEVER 325 MG PO TABS: 325 mg | ORAL | Qty: 2

## 2019-11-15 MED FILL — ISOVUE-370 76 % IV SOLN: 76 % | INTRAVENOUS | Qty: 100

## 2019-11-15 MED FILL — ASPERCREME LIDOCAINE 4 % EX PTCH: 4 % | CUTANEOUS | Qty: 1

## 2019-11-15 NOTE — ED Notes (Signed)
Patient ambulated to the restroom without difficulty.      Shelbie Hutching, RN  11/15/19 [phone]

## 2019-11-15 NOTE — Progress Notes (Signed)
Occupational Therapy    [facility] ACUTE CARE OCCUPATIONAL THERAPY EVALUATION  Gerald Hill, 1945-12-11, ED39/ED-39, 11/15/2019    History  HOPI:  The primary encounter diagnosis was Non-intractable vomiting with nausea, unspecified vomiting type. Diagnoses of Diarrhea, unspecified type, Generalized weakness, Fall, initial encounter, Abrasions of multiple sites, Compression fracture of T2 vertebra, initial encounter (HCC), Compression fracture of T3 vertebra, initial encounter (HCC), Abnormal x-ray of bone, and Contusion of left lower leg, initial encounter were also pertinent to this visit.  Patient  has a past medical history of Chronic pancreatitis (HCC).  Patient  has no past surgical history on file.    Subjective:  Patient states:  "I just get a little short of breath when I walk".    Pain:  7/20 pain pancreas  Pain Intervention: Increased movement, repositioned, RN notified  Pain Intervention: Increased movement, repositioned, RN notified.    Communication with other providers:  Handoff to RN, co-eval with PT  Restrictions: General Precautions, Fall Risk    Home Setup/Prior level of function  Social/Functional History  Lives With: Alone  Type of Home: Trailer  Home Layout: One level  Home Access: Stairs to enter with rails  Entrance Stairs - Number of Steps: 4  Entrance Stairs - Rails: Both  Bathroom Shower/Tub: Pension scheme manager: Radiation protection practitioner: Accessible  Home Equipment: Cane, Agricultural consultant, Art gallery manager  ADL Assistance: Independent  Homemaking Assistance: Banker: Yes  Ambulation Assistance: Independent  Transfer Assistance: Surveyor, minerals: No  Patient's Driver Info: friends/family  Occupation: Retired  Additional Comments: Pt reports no recent falls     Pt is planning to have a friend stay over after discharge    Examination of body systems (includes body structures/functions, activity/participation  limitations):  ?? Observation:  Supine in bed upon arrival, agreeable to therapy  ?? Vision:  Glasses  ?? Hearing:  [facility]  ?? Cardiopulmonary:  No 02 needs      Body Systems and functions:  ?? ROM R/L:  WFL.    ?? Strength R/L:  4+/5,   ?? Sensation: Denies numbness  ?? Tone: Normal  ?? Coordination: WFL  ?? Perception: WNL    Activities of Daily Living (ADLs):  ?? Feeding: modI  ?? Grooming: SBA (washing face w/ warm washcloth)  ?? UB bathing: Supervision  ?? LB bathing: SBA  ?? UB dressing: Supervision  ?? LB dressing: SBA  ?? Toileting: SBA    Cognitive and Psychosocial Functioning:  ?? Overall cognitive status: WNL  ?? Affect: Normal        Mobility:  ?? Supine to sit:  Supervision  ?? Transfers: SBA from EOB up to RW  ?? Sitting balance:  Supervision.    ?? Standing balance:  SBA w/ RW  ?? Functional Mobility: SBA w/ RW ~150 feet.  ?? Toilet/Shower Transfers: DNT             AM-PAC Daily Activity Inpatient   How much help for putting on and taking off regular lower body clothing?: A Little  How much help for Bathing?: A Little  How much help for Toileting?: A Little  How much help for putting on and taking off regular upper body clothing?: None  How much help for taking care of personal grooming?: A Little  How much help for eating meals?: None  AM-PAC Inpatient Daily Activity Raw Score: 20  AM-PAC Inpatient ADL T-Scale Score : 42.03  ADL Inpatient CMS 0-100% Score: 38.32  ADL Inpatient CMS G-Code Modifier : CJ    Treatment:  Therapeutic Activity Training:   Therapeutic activity training was instructed today.  Cues were given for safety, sequence, UE/LE placement, awareness, and balance.    Activities performed today included bed mobility training, sup-sit, sit-stand, functional mobility, stand to sit    Safety: patient left in bed, call light within reach, RN notified, gait belt used.    Assessment:  Pt is a 74 yo male admitted from home for fall. Pt at baseline is Independent for ADLs Independent for high level IADLs and Independent  for functional transfers/mobility w/ AD. Pt currently presents w/ deficits in ADL and high level IADL independence, functional activity tolerance, dynamic sitting and standing balance and tolerance and functional transfers and BUE strength and recent fall. Pt would benefit from continued acute care OT services w/ discharge to home w/ home health OT S1 w/ assist PRN (Pt's friend will be staying with patient after discharge)  Complexity: Low  Prognosis: Good, no significant barriers to participation at this time.   Plan  Times per week: 1x+  Times per day: Daily  Current Treatment Recommendations: Strengthening, Teaching laboratory technician, Equities trader, Mining engineer, Education, Sports administrator, Engineer, petroleum / ADL, ROM, Location manager, Pain Management, Home Designer, fashion/clothing, Building services engineer, Patent attorney, Positioning     Equipment: defer    Goals:  Pt goal: go home  Time Frame for STGs: discharge  Goal 1: Pt will perform UE ADLs Independent  Goal 2: Pt will perform LE ADLs Independent  Goal 3: Pt will perform toileting Independent  Goal 4: Pt will perform functional transfer w/ AD modI  Goal 5: Pt will perform functional mobility w/ AD modI  Goal 6: Pt will perform therex/theract in order to increase functional activity tolerance and dynamic standing balance    Treatment plan:  Pt will perform functional task in stand reaching in all 3 planes in order to increase dynamic standing balance and functional activity tolerance    Recommendations for NURSING activity: Up to chair for all 3 meals and up to standard commode for all toileting needs    Time:   Time in: 0838  Time out: 0854  Timed treatment minutes: 10 minutes  Total time: 25 minutes    Electronically signed by:    Bennye Alm OTR/L [phone]  9:23 AM,11/15/2019

## 2019-11-15 NOTE — Progress Notes (Signed)
Hospitalist Addendum    Patient was seen and examined at the ER.  Admitted after a fall and being followed by orthopedic surgery consult.  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  With further discussion, patient indicates he quit taking it because of the need to have frequent blood checks.  Patient's Lovenox DVT prophylaxis was converted to treatment doses.  He will be maintained in-house overnight and will likely be changed to one of the DOAC's based on his insurance approval prior to discharge.  CT scan also showing bone infarction.  He is currently stable.    Electronically signed by Storm Frisk, MD on 11/15/19 at 7:18 PM EDT

## 2019-11-15 NOTE — ED Notes (Signed)
1900 called Dr Morton Peters to notify on in pt consult. Added to treatment team.      Berline Lopes  11/15/19 1901

## 2019-11-15 NOTE — Care Coordination-Inpatient (Signed)
CM consult per Trinidad Curet to assist with discharge planning. Pt came to ER after a fall, pt pressed his life alert button. EMS found house to be a safe concern feces on the floor, pt collects knifes and they were all over the place in the house. Pt wife passed recently, in past 6 mo.     CM visited pt who is alert and oriented he is able to tell me he lives alone. After his wife passed away, he moved back for [place] to be closer to family. Pt lives alone. States he is originally from [place]. Pt has two sisters: Alger Memos and Josey Forcier who is 73yrs old [phone]. Pt son-in-law Rosebud Poles [phone], checks on him often but was out of town and pt states he has feces on the floor of the house because he was sick couldn't get to the bathroom, was not feeling well enough to clean it up, he also doesn't have a bucket and mop.    Pt tells me he has insurance, Medicare. He has a Dr in [place], Dr Quay Campbell [phone]. He States Dr sends him to other Drs, he goes to, Pain management Dr for back pain prescribed, Oxycodone 7.5 mg,4 tx/day. Goes to a dr for insomnia prescribed Ambien 15 MG. Pt states he is only on those two medications.     Pt states he has chronic pancreatitis, was a drinker but quite 10 yrs ago, also quite smoking 10 years ago.    Pt claims he is safe to return home at discharge states he will put his knives, and will clean up the feces on the floor. Kathlene November son-in-law is available and checks on him.    BAM,RN/CM

## 2019-11-15 NOTE — H&P (Signed)
HISTORY AND PHYSICAL  (Hospitalist, Internal Medicine)  IDENTIFYING INFORMATION   PATIENT:  Gerald Hill  MRN:  [id number]  ADMIT DATE: 11/14/2019  TIME OF EVALUATION: 11/15/2019 6:06 AM    CHIEF COMPLAINT   Fall    HISTORY OF PRESENT ILLNESS   Gerald Hill is a 74 y.o. male admitted for fall, and inability to take care of himself.  Patient states that he had diarrhea recently, which now has resolved, and was found to have feces all over the home as per paramedics.  Family not within home, patient fell, and called his life alert system, paramedics came through the window, and brought him to the ED.  Patient states that he was already in the bed with the paramedics came, however it took about 30 minutes to get up from the floor.  Patient denies any focal motor deficits.  He states his fall was not associated dizziness, lightheadedness, or loss of consciousness.  Patient is not the best historian.  Albeit he has no complaints of CP, SOB, dizziness, N/V/C/D, abdominal pain, dysuria, joint pains, rash/boils, or fevers.       PMH listed below:    PAST MEDICAL, SURGICAL, FAMILY, and SOCIAL HISTORY     Past Medical History:   Diagnosis Date   ??? Chronic pancreatitis (HCC)      History reviewed. No pertinent surgical history.  History reviewed. No pertinent family history.  Family Hx of HTN  Family Hx as reviewed above, otherwise non-contributory  Social History     Socioeconomic History   ??? Marital status: Widowed     Spouse name: None   ??? Number of children: None   ??? Years of education: None   ??? Highest education level: None   Occupational History   ??? None   Tobacco Use   ??? Smoking status: None   Substance and Sexual Activity   ??? Alcohol use: None   ??? Drug use: None   ??? Sexual activity: None   Other Topics Concern   ??? None   Social History Narrative   ??? None     Social Determinants of Health     Financial Resource Strain:    ??? Difficulty of Paying Living Expenses:    Food Insecurity:    ??? Worried About Brewing technologist in the Last Year:    ??? Barista in the Last Year:    Transportation Needs:    ??? Freight forwarder (Medical):    ??? Lack of Transportation (Non-Medical):    Physical Activity:    ??? Days of Exercise per Week:    ??? Minutes of Exercise per Session:    Stress:    ??? Feeling of Stress :    Social Connections:    ??? Frequency of Communication with Friends and Family:    ??? Frequency of Social Gatherings with Friends and Family:    ??? Attends Religious Services:    ??? Database administrator or Organizations:    ??? Attends Engineer, structural:    ??? Marital Status:    Intimate Programme researcher, broadcasting/film/video Violence:    ??? Fear of Current or Ex-Partner:    ??? Emotionally Abused:    ??? Physically Abused:    ??? Sexually Abused:        MEDICATIONS   Medications Prior to Admission  Not in a hospital admission.    Current Medications  Current Facility-Administered Medications   Medication Dose Route Frequency Provider Last Rate  Last Admin   ??? lidocaine 4 % external patch 1 patch  1 patch Topical Once Sena Hitch, PA-C   1 patch at 11/15/19 2355   ??? sodium chloride flush 0.9 % injection 10 mL  10 mL IntraVENous 2 times per day Jadyn Barge V, MD       ??? sodium chloride flush 0.9 % injection 10 mL  10 mL IntraVENous PRN Aprel Egelhoff Terrilee Croak, MD       ??? 0.9 % sodium chloride infusion  25 mL IntraVENous PRN Syniah Berne Terrilee Croak, MD       ??? ondansetron (ZOFRAN-ODT) disintegrating tablet 4 mg  4 mg Oral Q8H PRN Kush Farabee Terrilee Croak, MD        Or   ??? ondansetron (ZOFRAN) injection 4 mg  4 mg IntraVENous Q6H PRN Alesi Zachery Terrilee Croak, MD       ??? bisacodyl (DULCOLAX) EC tablet 5 mg  5 mg Oral Daily PRN Kipp Shank Terrilee Croak, MD       ??? acetaminophen (TYLENOL) tablet 650 mg  650 mg Oral Q6H PRN Donia Yokum Terrilee Croak, MD   650 mg at 11/15/19 0540    Or   ??? acetaminophen (TYLENOL) suppository 650 mg  650 mg Rectal Q6H PRN Jaclynne Baldo Terrilee Croak, MD       ??? enoxaparin (LOVENOX) injection 40 mg  40 mg SubCUTAneous Daily Luster Hechler Terrilee Croak, MD       ??? oxyCODONE-acetaminophen (PERCOCET) 5-325 MG  per tablet 1 tablet  1 tablet Oral Q6H PRN Cecilia Dapilah, APRN - CNP         No current outpatient medications on file.         Allergies  No Known Allergies    REVIEW OF SYSTEMS   Within above limitations. 14 point review of systems reviewed. Pertinent positive or negative as per HPI or otherwise negative per 14 point systems review.      PHYSICAL EXAM     Wt Readings from Last 3 Encounters:   11/15/19 170 lb (77.1 kg)       Blood pressure (!) 167/77, pulse 69, temperature 98.7 ??F (37.1 ??C), temperature source Oral, resp. rate 18, height 5\' 11"  (1.803 m), weight 170 lb (77.1 kg), SpO2 98 %.    General - AAO x 3  Psych - Appropriate affect/speech. No agitation  Eyes - Eye lids intact. No scleral icterus  ENT - Lips wnl. External ear clear/dry/intact. No thyromegaly on inspection  Neuro - No gross peripheral or central neuro deficits on inspection  Heart - Sinus. RRR. S1 and S2 present. No added HS/murmurs appreciated. No elevated JVD appreciated  Lung - Adequate air entry b/l, No crackles/wheezes appreciated  GI - Soft. No guarding/rigidity. No hepatosplenomegaly/ascites. BS+  GU - No CVA/suprapubic tenderness or palpable bladder distension  Skin - Intact. No rash/petechiae/ecchymosis. Warm extremities  MSK - Joints with normal ROM. No joint swellings    Lines/Drains/Airways/Wounds:  @LDABRIEF @    LABS AND IMAGING   CBC  @CBCROUNDS @    Last 3 Hemoglobin  Lab Results   Component Value Date    HGB 13.2 11/14/2019     Last 3 WBC/ANC  Lab Results   Component Value Date    WBC 7.0 11/14/2019     No components found for: GRNLOCTYABS  Last 3 Platelets  No results found for: PLATELET  Chemistry  @CHEMROUNDS @  @LYTESROUNDS @  No results found for: LDH  Coagulation Studies  No results found for: PTT, INR  Liver Function  Studies  Lab Results   Component Value Date    ALT 14 11/14/2019    AST 21 11/14/2019    ALKPHOS 54 11/14/2019       Recent Imaging        Relevant labs and imaging reviewed    ASSESSMENT AND PLAN   Gerald Hill is a 74 y.o. male p/w    Mechanical fall, possibly with dizziness  - witnessed, with no head trauma  - ECHO  - CT head with no acute findings  - neurochecks q4 hour  - pain control  - PT/OT    History of chronic pancreatitis, denies any acute abdominal pain at this time.  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  Lower extremity Dopplers, as patient states that he has had multiple DVTs in the past, will need risk assessment whether restart Coumadin  Hematology consult  Follow-up INR        Case d/w ED provider    DVT ppx: Lovenox  Code status: Full    [facility], Internal Medicine  11/15/2019 at 6:06 AM

## 2019-11-15 NOTE — ED Notes (Signed)
Covered patient with more warm blankets. Denies needing anything at this time. Will continue to monitor.      Shelbie Hutching, RN  11/15/19 [phone]

## 2019-11-15 NOTE — ED Notes (Signed)
Bed: ED-29  Expected date:   Expected time:   Means of arrival:   Comments:  Rm [address] RN  11/15/19 2249

## 2019-11-15 NOTE — ED Notes (Signed)
Notified Cecilia NP of patient requesting his Remus Loffler.     Schuyler Amor, RN  11/15/19 2354

## 2019-11-16 DIAGNOSIS — I82403 Acute embolism and thrombosis of unspecified deep veins of lower extremity, bilateral: Secondary | ICD-10-CM

## 2019-11-16 LAB — CBC WITH AUTO DIFFERENTIAL
Basophils %: 0.6 % (ref 0–1)
Basophils Absolute: 0 10*3/uL
Eosinophils %: 3.1 % — ABNORMAL HIGH (ref 0–3)
Eosinophils Absolute: 0.2 10*3/uL
Hematocrit: 34.5 % — ABNORMAL LOW (ref 42–52)
Hemoglobin: 11.1 GM/DL — ABNORMAL LOW (ref 13.5–18.0)
Immature Neutrophil %: 0.4 % (ref 0–0.43)
Lymphocytes %: 20.7 % — ABNORMAL LOW (ref 24–44)
Lymphocytes Absolute: 1.1 10*3/uL
MCH: 28.8 PG (ref 27–31)
MCHC: 32.2 % (ref 32.0–36.0)
MCV: 89.4 FL (ref 78–100)
MPV: 9.3 FL (ref 7.5–11.1)
Monocytes %: 10.6 % — ABNORMAL HIGH (ref 0–4)
Monocytes Absolute: 0.6 10*3/uL
Nucleated RBC %: 0 %
Platelets: 149 10*3/uL (ref 140–440)
RBC: 3.86 10*6/uL — ABNORMAL LOW (ref 4.6–6.2)
RDW: 14.5 % (ref 11.7–14.9)
Segs Absolute: 3.4 10*3/uL
Segs Relative: 64.6 % (ref 36–66)
Total Immature Neutrophil: 0.02 10*3/uL
Total Nucleated RBC: 0 10*3/uL
WBC: 5.2 10*3/uL (ref 4.0–10.5)

## 2019-11-16 LAB — PROTIME/INR & PTT
INR: 1.04 INDEX
Protime: 13.4 SECONDS (ref 11.7–14.5)
aPTT: 39.8 SECONDS — ABNORMAL HIGH (ref 25.1–37.1)

## 2019-11-16 LAB — BASIC METABOLIC PANEL W/ REFLEX TO MG FOR LOW K
Anion Gap: 8 (ref 4–16)
BUN: 9 MG/DL (ref 6–23)
CO2: 30 MMOL/L (ref 21–32)
Calcium: 8.7 MG/DL (ref 8.3–10.6)
Chloride: 103 mMol/L (ref 99–110)
Creatinine: 0.9 MG/DL (ref 0.9–1.3)
GFR African American: >60 mL/min/{1.73_m2} (ref >60)
GFR Non-African American: >60 mL/min/{1.73_m2} (ref >60)
Glucose: 91 MG/DL (ref 70–99)
Potassium: 3.9 MMOL/L (ref 3.5–5.1)
Sodium: 141 MMOL/L (ref 135–145)

## 2019-11-16 MED ORDER — HYDROXYZINE HCL 50 MG PO TABS
50 MG | Freq: Three times a day (TID) | ORAL | Status: DC | PRN
Start: 2019-11-16 — End: 2019-11-17

## 2019-11-16 MED ORDER — ZOLPIDEM TARTRATE 5 MG PO TABS
5 MG | Freq: Every evening | ORAL | Status: DC
Start: 2019-11-16 — End: 2019-11-17
  Administered 2019-11-16 – 2019-11-17 (×2): 10 mg via ORAL

## 2019-11-16 MED ORDER — APIXABAN 5 MG PO TABS
5 MG | Freq: Two times a day (BID) | ORAL | Status: DC
Start: 2019-11-16 — End: 2019-11-17

## 2019-11-16 MED ORDER — LISINOPRIL 10 MG PO TABS
10 MG | Freq: Every day | ORAL | Status: DC
Start: 2019-11-16 — End: 2019-11-17
  Administered 2019-11-16: 13:00:00 10 mg via ORAL

## 2019-11-16 MED ORDER — OXYCODONE-ACETAMINOPHEN 5-325 MG PO TABS
5-325 MG | ORAL_TABLET | Freq: Four times a day (QID) | ORAL | 0 refills | Status: AC | PRN
Start: 2019-11-16 — End: 2019-11-23

## 2019-11-16 MED ORDER — MIRTAZAPINE 15 MG PO TABS
15 MG | Freq: Every evening | ORAL | Status: DC
Start: 2019-11-16 — End: 2019-11-17
  Administered 2019-11-17: 01:00:00 15 mg via ORAL

## 2019-11-16 MED ORDER — APIXABAN 5 MG PO TABS
5 MG | ORAL_TABLET | Freq: Two times a day (BID) | ORAL | 5 refills | Status: AC
Start: 2019-11-16 — End: ?

## 2019-11-16 MED ORDER — MELATONIN 3 MG PO TABS
3 MG | Freq: Every evening | ORAL | Status: DC | PRN
Start: 2019-11-16 — End: 2019-11-17
  Administered 2019-11-16: 02:00:00 3 mg via ORAL

## 2019-11-16 MED ORDER — PANCRELIPASE (LIP-PROT-AMYL) 6000-19000 UNITS PO CPEP
6000-19000 units | Freq: Three times a day (TID) | ORAL | Status: DC
Start: 2019-11-16 — End: 2019-11-17
  Administered 2019-11-16 – 2019-11-17 (×2): 36000 [IU] via ORAL

## 2019-11-16 MED ORDER — TAMSULOSIN HCL 0.4 MG PO CAPS
0.4 MG | Freq: Every evening | ORAL | Status: DC
Start: 2019-11-16 — End: 2019-11-17
  Administered 2019-11-16 – 2019-11-17 (×2): 0.4 mg via ORAL

## 2019-11-16 MED ORDER — APIXABAN 5 MG PO TABS
5 MG | ORAL_TABLET | Freq: Two times a day (BID) | ORAL | 0 refills | Status: AC
Start: 2019-11-16 — End: 2019-11-23
  Filled 2019-11-16: qty 28, 14d supply, fill #0
  Filled 2019-11-16: qty 60, 30d supply, fill #0

## 2019-11-16 MED ORDER — AMLODIPINE BESYLATE 5 MG PO TABS
5 MG | Freq: Every day | ORAL | Status: DC
Start: 2019-11-16 — End: 2019-11-17
  Administered 2019-11-16 – 2019-11-17 (×2): 5 mg via ORAL

## 2019-11-16 MED ORDER — APIXABAN 5 MG PO TABS
5 MG | Freq: Two times a day (BID) | ORAL | Status: DC
Start: 2019-11-16 — End: 2019-11-17
  Administered 2019-11-17 (×2): 10 mg via ORAL

## 2019-11-16 MED FILL — LISINOPRIL 10 MG PO TABS: 10 mg | ORAL | Qty: 1

## 2019-11-16 MED FILL — CREON 6000-19000 UNITS PO CPEP: 6000-19000 [IU] | ORAL | Qty: 6

## 2019-11-16 MED FILL — LOVENOX 80 MG/0.8ML SC SOLN: 80 MG/0.8ML | SUBCUTANEOUS | Qty: 0.8

## 2019-11-16 MED FILL — AMLODIPINE BESYLATE 5 MG PO TABS: 5 mg | ORAL | Qty: 1

## 2019-11-16 MED FILL — MELATONIN 3 MG PO TABS: 3 mg | ORAL | Qty: 1

## 2019-11-16 MED FILL — TAMSULOSIN HCL 0.4 MG PO CAPS: 0.4 mg | ORAL | Qty: 1

## 2019-11-16 MED FILL — OXYCODONE-ACETAMINOPHEN 5-325 MG PO TABS: 5-325 mg | ORAL | Qty: 1

## 2019-11-16 MED FILL — HYDROXYZINE HCL 50 MG PO TABS: 50 mg | ORAL | Qty: 1

## 2019-11-16 MED FILL — ZOLPIDEM TARTRATE 5 MG PO TABS: 5 mg | ORAL | Qty: 2

## 2019-11-16 MED FILL — ONDANSETRON 4 MG PO TBDP: 4 mg | ORAL | Qty: 1

## 2019-11-16 NOTE — Progress Notes (Signed)
Outpatient Pharmacy Progress Note for Meds-to-Beds    Total number of Prescriptions Filled: 1  The following medications were delivered to the patient or nursing unit during the discharge process:  ??? Eliquis    Additional Documentation:  ??? The pharmacy team did apply a Eliquis coupon card to provide patient with a 30-day free supply of Eliquis  ??? Rx for Percocet on hold due to patient filling a 30-day supply on 11/11/2019.      Thank you for letting us serve your patients.  [facility] - [facility]    [address]    [place], [place] [address]    Phone: [phone]    Fax: [phone]

## 2019-11-16 NOTE — ED Notes (Signed)
Phone report called to floor charge RN for transfer to unit and assumption of care.     Stefano Gaul, RN  11/16/19 1246

## 2019-11-16 NOTE — Care Coordination-Inpatient (Signed)
CM - room 29 now shipped to 4107--admission --Dr Alison Stalling had been seeing pt previous.among a few things medically pt has had blood clots bi- laterally -has had them before, put on Coumadin --but stopped in February this year for unknown reasons . Pt at first could not identify PCP , not the insurance carrier for his prescriptions  -now M. Allena Katz MD --he states his follow up PCP is Dr Ezequiel Essex in [place], . Dr Trey Paula Consulted.  Pt is being admitted for several reasons --recent fall injuries, failure to thrive , multiple blood clots lower legs  bi - laterally Emergency contacts Rosebud Poles [phone]---BXUXY Payeur (sister) [phone].    The Question arose on the use of Eliquis  -Pt has Medicare and an unknown payer source for Prescription drugs. It is not known if Eliquis is covered by his Medicare or other source , but pt was accepting to the idea of a 30 day free trial of Eliquis and a follow up with PCP or if need , go back to Coumadin .     Tom / RN / CM

## 2019-11-16 NOTE — Consults (Signed)
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   I Discussed with him today his x-ray and CT findings.  I reassured him that there is no surgical indication or need for any treatment for this at this point.  Continue weight-bearing as tolerated.  Continue range of motion exercises as instructed.  Ice and elevate as needed.  Tylenol or Motrin for pain.  Follow up as needed.  Orthopedically he is stable for discharge.      Gerald Hill is a 74 year old male who was admitted on this occasion due to DVT and overall weakness. He states that he did sustain a fall yesterday hitting his left leg. He did have some pain in the left leg but since that time the pain has improved. He was subsequently admitted to the hospitalist for medical management and I was consulted for evaluation of the lesion found on his CT scan.    He states that he is currently not having any pain in the left leg. He states that he did have a lesion in his left calcaneus cleaned out and replaced with cement and he has not had any issues with his left foot since that time. He states that he has been told a long time ago that he has a lesion in his left proximal tibia but this is not bothering him so no treatment was recommended at the time. He is currently not having any pain in the left leg.  Patient denies any new injury to the involved extremity/ joint, denies numbness or tingling in the involved extremity and denies fever or chills.        Review of Systems   Constitutional: Negative for activity change, chills and fever.   Respiratory: Negative for chest tightness.    Cardiovascular: Negative for chest pain.   Musculoskeletal:  Positive for joint swelling. Negative for arthralgias, back pain, gait problem and myalgias.   Skin: Negative for color change, pallor, rash and wound.   Neurological: Negative for weakness and numbness.       Past Medical History:   Diagnosis Date   ??? Chronic pancreatitis (HCC)      No current facility-administered medications on file prior to encounter.     Current Outpatient Medications on File Prior to Encounter   Medication Sig Dispense Refill   ??? amLODIPine (NORVASC) 5 MG tablet amlodipine 5 mg tablet     ??? lisinopril (PRINIVIL;ZESTRIL) 10 MG tablet lisinopril 10 mg tablet   TAKE ONE TABLET BY MOUTH DAILY     ??? mirtazapine (REMERON) 15 MG tablet Take 1 tablet by mouth nightly     ??? QUEtiapine (SEROQUEL) 200 MG tablet quetiapine 200 mg tablet     ??? tamsulosin (FLOMAX) 0.4 MG capsule tamsulosin 0.4 mg capsule     ??? lipase-protease-amylase (CREON) 36000-114000 units CPEP delayed release capsule Creon 36,000 unit-114,000 unit-180,000 unit capsule,delayed release   TAKE 2 CAPSULES BY MOUTH WITH MEALS AND 1 WITH SNACKS     ??? gabapentin (NEURONTIN) 600 MG tablet      ??? hydrOXYzine (ATARAX) 25 MG tablet TAKE 1 TABLET BY MOUTH THREE TIMES DAILY AS NEEDED     ??? zolpidem (  AMBIEN) 10 MG tablet Take by mouth nightly.       No Known Allergies  History reviewed. No pertinent surgical history.  Social History     Tobacco Use   ??? Smoking status: Not on file   Substance Use Topics   ??? Alcohol use: Not on file   ??? Drug use: Not on file     History reviewed. No pertinent family history.    Right Ankle Exam   Right ankle exam is normal.      Left Ankle Exam   Left ankle exam is normal.      Right Knee Exam     Muscle Strength   The patient has normal right knee strength.    Tenderness   The patient is experiencing no tenderness.     Range of Motion   The patient has normal right knee ROM.    Other   Erythema: absent  Sensation: normal  Pulse: present  Swelling: none  Effusion: no effusion present      Left Knee Exam     Muscle  Strength   The patient has normal left knee strength.    Tenderness   The patient is experiencing no tenderness.     Range of Motion   The patient has normal left knee ROM.    Other   Erythema: absent  Sensation: normal  Pulse: present  Swelling: none  Effusion: no effusion present    Comments:  Left knee-Skin intact with no erythema, ecchymosis or lacerations present.  Full range of motion with no pain in the left knee or leg.  Intact sensation motor function to all nerve distributions of the extremity.  +2 DP  Compartment soft.        Right Hip Exam   Right hip exam is normal.     Tenderness   The patient is experiencing no tenderness.     Muscle Strength   The patient has normal right hip strength.    Other   Erythema: absent  Sensation: normal  Pulse: present      Left Hip Exam   Left hip exam is normal.    Tenderness   The patient is experiencing no tenderness.     Muscle Strength   The patient has normal left hip strength.     Other   Erythema: absent  Sensation: normal  Pulse: present            BP (!) 159/68    Pulse 69    Temp 98.4 ??F (36.9 ??C) (Oral)    Resp 16    Ht 5\' 11"  (1.803 m)    Wt 170 lb (77.1 kg)    SpO2 96%    BMI 23.71 kg/m??         XR PELVIS (1-2 VIEWS)    Result Date: 11/14/2019  EXAMINATION: ONE XRAY VIEW OF THE PELVIS 11/14/2019 10:51 pm COMPARISON: None. HISTORY: ORDERING SYSTEM PROVIDED HISTORY: fall TECHNOLOGIST PROVIDED HISTORY: Reason for exam:->fall Reason for Exam: fall Acuity: Acute Type of Exam: Initial FINDINGS: Surgical clips are seen overlying the pelvis.  There is no acute fracture, dislocation, or bone destruction.  Vascular calcifications are present.     No acute findings.     XR TIBIA FIBULA LEFT (2 VIEWS)    Result Date: 11/15/2019  EXAMINATION: XRAY VIEWS OF THE LEFT TIBIA AND FIBULA 11/15/2019 12:05 am COMPARISON: None. HISTORY: ORDERING SYSTEM PROVIDED HISTORY: Injury, fall TECHNOLOGIST PROVIDED HISTORY: PORTABLE Reason for exam:->Injury, fall Reason  for Exam: Injury, fall  Acuity: Acute Type of Exam: Initial Relevant Medical/Surgical History: Best possible lateral due to pt condition FINDINGS: Bones demonstrate normal alignment. Tibial plateau multifocal ill-defined marrow hyperdensities are noted with suggestion ring and arc calcification. Joint spaces are preserved. Soft tissues are unremarkable.     1. No radiographic evidence of acute left tibia/fibula abnormality. 2. Tibial plateau marrow ill-defined hypodensities, concerning for presence of chondroid matrix.  Recommend CT lower extremity without and with contrast examination for further evaluation.       CT TIBIA FIBULA LEFT W WO CONTRAST    Result Date: 11/15/2019  EXAMINATION: CT OF THE LEFT TIBIA AND FIBULA WITH AND WITHOUT CONTRAST 11/15/2019 5:50 pm TECHNIQUE: CT of the left tibia and fibula was performed with and without the administration of intravenous contrast.  Multiplanar reformatted images are provided for review. Dose modulation, iterative reconstruction, and/or weight based adjustment of the mA/kV was utilized to reduce the radiation dose to as low as reasonably achievable. COMPARISON: Left tibia/fibula radiographs 11/15/2019. HISTORY ORDERING SYSTEM PROVIDED HISTORY: further evaluate for possible chondroid matrix TECHNOLOGIST PROVIDED HISTORY: Reason for exam:->further evaluate for possible chondroid matrix Reason for Exam: further evaluate for possible chondroid matrix Acuity: Acute Type of Exam: Initial Additional signs and symptoms: no Relevant Medical/Surgical History: 76 ML ISOVUE 370 USED. FINDINGS: Bones: There is a sclerotic lesion in the proximal tibial metaphysis extending to the posterior articular surface of the medial tibial plateau with curvilinear margins likely representing a bone infarct.  Surgical cement is seen in the posterior calcaneus.  No fracture or dislocation.  No other abnormal marrow space-occupying lesion. Soft Tissue: Atherosclerotic vascular calcifications are seen.  The visualized  tendons are grossly intact.  No abnormal soft tissue mass or fluid collection. Joint: No knee effusion or popliteal cyst.  No significant degenerative changes of the knee.  The tibiotalar and subtalar joints are normal in appearance.  The talonavicular and calcaneocuboid joints are unremarkable. Mild midfoot degenerative changes.  Normal midfoot alignment is maintained. The metatarsophalangeal joints are intact.     1. 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 2. Surgical cement in the posterior calcaneus compatible with curettage and cement filling.

## 2019-11-16 NOTE — ED Notes (Signed)
Cecilia NP at bedside.     Schuyler Amor, RN  11/16/19 [phone]

## 2019-11-16 NOTE — Consults (Signed)
[facility] ([facility])  CONSULTATION REPORT    REASON FOR CONSULT    anticoagulation    CHIEF COMPLAINT    Chief Complaint   Patient presents with    Other     failure to thrive    Fall     frequent       HISTORY OF PRESENT ILLNESS   Gerald Hill is a 74 y.o. male who presents after having had a fall and an loss of functional status. Reports last coumadin three to four months ago.    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 Lower extremity venous doppler reveals     Impression  Acute DVT noted within the distal femoral vein, popliteal vein, and right  peroneal vein.     Acute DVT noted within the left common femoral vein, femoral vein and  posterior tibial vein, with the peroneal vein not visualized.       Reports he does not follow with PCP. Denies smoking. Denies recent instrumentation. He is not very active. Denies supplemental hormones.    Due to history of multiple DVTs and acute DVT we were called to evaluate.       PAST MEDICAL HISTORY    Past Medical History:   Diagnosis Date    Chronic pancreatitis (HCC)        SURGICAL HISTORY    History reviewed. No pertinent surgical history.    FAMILY HISTORY    History reviewed. No pertinent family history.    SOCIAL HISTORY    Social History     Socioeconomic History    Marital status: Widowed     Spouse name: None    Number of children: None    Years of education: None    Highest education level: None   Occupational History    None   Tobacco Use    Smoking status: None   Substance and Sexual Activity    Alcohol use: None    Drug use: None    Sexual activity: None   Other Topics Concern    None   Social History Narrative    None     Social Determinants of Health     Financial Resource Strain:     Difficulty of Paying Living Expenses:    Food Insecurity:     Worried About Programme researcher, broadcasting/film/video in the Last Year:     Barista in the Last Year:    Transportation Needs:     Freight forwarder  (Medical):     Lack of Transportation (Non-Medical):    Physical Activity:     Days of Exercise per Week:     Minutes of Exercise per Session:    Stress:     Feeling of Stress :    Social Connections:     Frequency of Communication with Friends and Family:     Frequency of Social Gatherings with Friends and Family:     Attends Religious Services:     Active Member of Clubs or Organizations:     Attends Engineer, structural:     Marital Status:    Intimate Partner Violence:     Fear of Current or Ex-Partner:     Emotionally Abused:     Physically Abused:     Sexually Abused:        REVIEW OF SYSTEMS    Constitutional:  Denies fever, chills, loss of appetite,  weight loss, tiredness, fatigue or weakness   HEENT:  Denies swelling of neck glands  Respiratory:  Denies cough, shortness of breath or hemoptysis  Cardiovascular:  Denies chest pain, palpitations or swelling   GI:  Denies abdominal pain, nausea, vomiting, constipation or diarrhea   Musculoskeletal:  Denies back pain   Skin:  Denies rash   Neurologic:  Denies headache, focal weakness or sensory changes   All systems negative except as marked.     PHYSICAL EXAM    Vitals: BP (!) 146/89    Pulse 74    Temp 98.4 ??F (36.9 ??C) (Oral)    Resp 16    Ht 5\' 11"  (1.803 m)    Wt 170 lb (77.1 kg)    SpO2 96%    BMI 23.71 kg/m??   CONSTITUTIONAL: awake, alert, cooperative, no apparent distress   EYES: sclera clear and conjunctiva normal  ENT: Normocephalic, without obvious abnormality, atraumatic  NECK: supple, symmetrical  HEMATOLOGIC/LYMPHATIC: no cervical, supraclavicular or axillary lymphadenopathy   LUNGS:no increased work of breathing and clear to auscultation   CARDIOVASCULAR: regular rate and rhythm, normal S1 and S2, no murmur noted  ABDOMEN: normal bowel sounds x 4, soft, non-distended, non-tender, no masses palpated, no hepatosplenomgaly   MUSCULOSKELETAL: full range of motion noted, tone is normal  NEUROLOGIC: awake, alert, oriented to name, place and  time. Motor skills grossly intact.   SKIN: Normal skin color, texture, turgor and no jaundice. Skin appears intact   EXTREMITIES: no LE edema    LABORATORY RESULTS  CBC:   Recent Labs     11/14/19  2329 11/16/19  0405   WBC 7.0 5.2   HGB 13.2* 11.1*   PLT 202 149     BMP:    Recent Labs     11/14/19  2329 11/16/19  0405   NA 143 141   K 4.0 3.9   CL 103 103   CO2 29 30   BUN 11 9   CREATININE 0.9 0.9   GLUCOSE 98 91     Hepatic:   Recent Labs     11/14/19  2329   AST 21   ALT 14   BILITOT 0.6   ALKPHOS 54     INR:   Recent Labs     11/15/19  1300 11/16/19  0405   INR 1.02 1.04       ASSESSMENT    Acute DVT  Hx of multiple DVT    RECOMMENDATION    Acute DVT - noted left common femoral, femoral vein, posterior tibial vein and right peroneal vein, distal femoral vein and popliteal. We agree to change to DOAC. Advised to avoid falls, deep cuts.     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 May follow as an outpatient.     We will follow the patient. Thank you for allowing me to participate in the care of this very pleasant patient.    I have independently evaluated and examined this patient today.  I have reviewed radiologic and biochemical tests on this patient. Management Plan is developed mutually with 13/04/21, NP. I have reviewed above note and agree with assessment and plan

## 2019-11-16 NOTE — Progress Notes (Signed)
ATTENDING PHYSICIAN'S PROGRESS NOTES    Patient:  Gerald Hill      Unit/Bed:4107/4107-A    Date of Birth: January 07, 1946    MRN: [id number]     Acct: [id number]     Admit date: 11/14/2019    Patient Seen, Chart, Consults notes, Labs, Radiology studies reviewed.    SUBJECTIVE:   Day 2 of stay with a fall and found to have multiple bilateral DVT's of the LE; and most recent (in last 24 hours) has had improvement in his symptoms.    All other ROS negative except noted in HPI    Past, Family, Social History unchanged from admission.    Diet:  ADULT DIET; Regular    Medications:  Scheduled Meds:  ??? zolpidem  10 mg Oral Nightly   ??? amLODIPine  5 mg Oral Daily   ??? lipase-protease-amylase  36,000 Units Oral TID WC   ??? lisinopril  10 mg Oral Daily   ??? tamsulosin  0.4 mg Oral Nightly   ??? mirtazapine  15 mg Oral Nightly   ??? apixaban  10 mg Oral BID    Followed by   ??? [START ON 11/23/2019] apixaban  5 mg Oral BID   ??? sodium chloride flush  10 mL IntraVENous 2 times per day   ??? [Held by provider] enoxaparin  1 mg/kg SubCUTAneous BID   ??? enoxaparin  40 mg SubCUTAneous Once     Continuous Infusions:  ??? sodium chloride       PRN Meds:hydrOXYzine, sodium chloride flush, sodium chloride, ondansetron **OR** ondansetron, bisacodyl, acetaminophen **OR** acetaminophen, oxyCODONE-acetaminophen, melatonin    OBJECTIVE:    CBC:   Recent Labs     11/14/19  2329 11/16/19  0405 11/17/19  0431   WBC 7.0 5.2 4.8   HGB 13.2* 11.1* 10.7*   PLT 202 149 190     BMP:    Recent Labs     11/14/19  2329 11/16/19  0405 11/17/19  0431   NA 143 141 143   K 4.0 3.9 3.7   CL 103 103 104   CO2 29 30 31    BUN 11 9 10    CREATININE 0.9 0.9 1.0   GLUCOSE 98 91 84     Calcium:  Recent Labs     11/17/19  0431   CALCIUM 8.6     Ionized Calcium:No results for input(s): IONCA in the last 72 hours.  Magnesium:No results for input(s): MG in the last 72 hours.  Phosphorus:No results for input(s): PHOS in the last 72 hours.  BNP:No results for input(s): BNP in the  last 72 hours.  Glucose:No results for input(s): POCGLU in the last 72 hours.  HgbA1C: No results for input(s): LABA1C in the last 72 hours.  INR:   Recent Labs     11/17/19  0431   INR 1.42     Hepatic:   Recent Labs     11/14/19  2329   ALKPHOS 54   ALT 14   AST 21   PROT 6.6   BILITOT 0.6   LABALBU 3.8     Amylase and Lipase:No results for input(s): LACTA, AMYLASE in the last 72 hours.  Lactic Acid: No results for input(s): LACTA in the last 72 hours.  Troponin:   Recent Labs     11/14/19  2230 11/14/19  2329   CKTOTAL  --  88   TROPONINT <0.010  --      BNP: No results for input(s): BNP in  the last 72 hours.  Lipids: No results for input(s): CHOL, TRIG, HDL, LDLCALC in the last 72 hours.    Invalid input(s): LDL  ABGs: No results found for: PH, PCO2, PO2, HCO3, O2SAT    Radiology reports as per the Radiologist  Radiology: Echocardiogram complete 2D with doppler with color    Result Date: 11/15/2019  Transthoracic Echocardiography Report (TTE)  Demographics   Patient Name       Sofie HartiganCARPENTER Petr    Date of Study       11/15/2019   Date of Birth      June 23, 1945        Gender              Male   Age                74 year(s)        Race                Caucasian   Patient Number     [id number]        Room Number         ED39   Visit Number       [id number]   Corporate ID       [id number]   Accession Number   [id number]        Sonographer         Lubertha SouthAllen, Katherine                                                           RCS, RN   Ordering Physician Fulton MoleMayank V Patel MD Interpreting        Ellyn HackAlam, Mian Bilal MD                                       Physician  Procedure Type of Study   TTE procedure:ECHOCARDIOGRAM COMPLETE 2D W DOPPLER W COLOR.  Procedure Date Date: 11/15/2019 Start: 06:44 AM Study Location: Portable Technical Quality: Fair visualization Indications:Syncope. Patient Status: Routine Height: 71 inches Weight: 170 pounds BSA: 1.97 m2 BMI: 23.71 kg/m2 HR: 70 bpm BP: 167/77 mmHg  Conclusions   Summary  Left ventricular  systolic function is normal.  Ejection fraction is visually estimated at 60%.  Mild left ventricular hypertrophy.  Indeterminate diastolic function; E/A flow reversal is noted.  No significant valvular disease noted.  No evidence of any pericardial effusion.   Signature   ------------------------------------------------------------------  Electronically signed by Ellyn HackAlam, Mian Bilal MD (Interpreting  physician) on 11/15/2019 at 10:09 AM  ------------------------------------------------------------------   Findings   Left Ventricle  Left ventricular systolic function is normal.  Ejection fraction is visually estimated at 60%.  Mild left ventricular hypertrophy.  Indeterminate diastolic function; E/A flow reversal is noted.  Left ventricle size is normal.   Left Atrium  Essentially normal left atrium.   Right Atrium  Essentially normal right atrium.   Right Ventricle  Essentially normal right ventricle.   Aortic Valve  Structurally normal aortic valve.   Mitral Valve  Trace mitral regurgitation is present.   Tricuspid Valve  Trace tricuspid regurgitation is present.   Pulmonic Valve  Trace pulmonic regurgitation present.   Pericardial Effusion  No evidence  of any pericardial effusion.  M-Mode/2D Measurements & Calculations   LV Diastolic Dimension:  LV Systolic Dimension:  LA Dimension: 3.7 cmAO Root  3.84 cm                  2.88 cm                 Dimension: 3 cmLA Area:  LV FS:25 %               LV Volume Diastolic: 50 16.7 cm2  LV PW Diastolic: 1.31 cm ml  Septum Diastolic: 1.32   LV Volume Systolic: 22  cm                       ml  CO: 3.62 l/min           LV EDV/LV EDV Index: 50 RV Diastolic Dimension:  CI: 1.84 l/m*m2          ml/25 m2LV ESV/LV ESV   2.46 cm                           Index: 22 ml/11 m2  LV Area Diastolic: 20.4  EF Calculated (A4C): 56 LA/Aorta: 1.23  cm2                      %  LV Area Systolic: 12.1   EF Calculated (2D):     LA volume/Index: 47 ml  cm2                      50.1 %                   /7m2                            LV Length: 6.64 cm                            LVOT: 1.7 cm  Doppler Measurements & Calculations   MV Peak E-Wave: 37.5    AV Peak Velocity: 125 cm/s   LVOT Peak Velocity: 97  cm/s                    AV Peak Gradient: 6.25 mmHg  cm/s  MV Peak A-Wave: 91.3    AV Mean Velocity: 90.2 cm/s  LVOT Mean Velocity:  cm/s                    AV Mean Gradient: 4 mmHg     63.9 cm/s  MV E/A Ratio: 0.41      AV VTI: 25.4 cm              LVOT Peak Gradient: 4  MV Peak Gradient: 0.56  AV Area (Continuity):2.04    mmHgLVOT Mean Gradient:  mmHg                    cm2                          2 mmHg   MV P1/2t: 57 msec       LVOT VTI: 22.8 cm  MVA by PHT:3.86 cm2   MV E' Septal Velocity:  3.73 cm/s  MV E' Lateral Velocity:  4.17 cm/s  MV E/E' septal: 10.05  MV E/E' lateral: 8.99      XR PELVIS (1-2 VIEWS)    Result Date: 11/14/2019  EXAMINATION: ONE XRAY VIEW OF THE PELVIS 11/14/2019 10:51 pm COMPARISON: None. HISTORY: ORDERING SYSTEM PROVIDED HISTORY: fall TECHNOLOGIST PROVIDED HISTORY: Reason for exam:->fall Reason for Exam: fall Acuity: Acute Type of Exam: Initial FINDINGS: Surgical clips are seen overlying the pelvis.  There is no acute fracture, dislocation, or bone destruction.  Vascular calcifications are present.     No acute findings.     XR TIBIA FIBULA LEFT (2 VIEWS)    Result Date: 11/15/2019  EXAMINATION: XRAY VIEWS OF THE LEFT TIBIA AND FIBULA 11/15/2019 12:05 am COMPARISON: None. HISTORY: ORDERING SYSTEM PROVIDED HISTORY: Injury, fall TECHNOLOGIST PROVIDED HISTORY: PORTABLE Reason for exam:->Injury, fall Reason for Exam: Injury, fall Acuity: Acute Type of Exam: Initial Relevant Medical/Surgical History: Best possible lateral due to pt condition FINDINGS: Bones demonstrate normal alignment. Tibial plateau multifocal ill-defined marrow hyperdensities are noted with suggestion ring and arc calcification. Joint spaces are preserved. Soft tissues are unremarkable.     1. No radiographic evidence of  acute left tibia/fibula abnormality. 2. Tibial plateau marrow ill-defined hypodensities, concerning for presence of chondroid matrix.  Recommend CT lower extremity without and with contrast examination for further evaluation.     CT HEAD WO CONTRAST    Result Date: 11/14/2019  EXAMINATION: CT OF THE HEAD WITHOUT CONTRAST  11/14/2019 11:05 pm TECHNIQUE: CT of the head was performed without the administration of intravenous contrast. Dose modulation, iterative reconstruction, and/or weight based adjustment of the mA/kV was utilized to reduce the radiation dose to as low as reasonably achievable. COMPARISON: None. HISTORY: ORDERING SYSTEM PROVIDED HISTORY: fall TECHNOLOGIST PROVIDED HISTORY: Has a "code stroke" or "stroke alert" been called?->No Reason for exam:->fall Decision Support Exception - unselect if not a suspected or confirmed emergency medical condition->Emergency Medical Condition (MA) Reason for Exam: fall, head/neck pain. Acuity: Acute Type of Exam: Initial Mechanism of Injury: fall, head/neck pain. Relevant Medical/Surgical History: none FINDINGS: BRAIN/VENTRICLES: There is no acute intracranial hemorrhage, mass effect or midline shift.  No abnormal extra-axial fluid collection.  The gray-white differentiation is maintained without evidence of an acute infarct.  There is no evidence of hydrocephalus. Generalized atrophy is noted with chronic microvascular ischemic changes. ORBITS: The visualized portion of the orbits demonstrate no acute abnormality. SINUSES: The visualized paranasal sinuses and mastoid air cells demonstrate no acute abnormality. SOFT TISSUES/SKULL:  No acute abnormality of the visualized skull or soft tissues.     No acute intracranial abnormality.     CT CERVICAL SPINE WO CONTRAST    Result Date: 11/14/2019  EXAMINATION: CT OF THE CERVICAL SPINE WITHOUT CONTRAST 11/14/2019 11:06 pm TECHNIQUE: CT of the cervical spine was performed without the administration of intravenous contrast.  Multiplanar reformatted images are provided for review. Dose modulation, iterative reconstruction, and/or weight based adjustment of the mA/kV was utilized to reduce the radiation dose to as low as reasonably achievable. COMPARISON: None. HISTORY: ORDERING SYSTEM PROVIDED HISTORY: fall TECHNOLOGIST PROVIDED HISTORY: Reason for exam:->fall Decision Support Exception - unselect if not a suspected or confirmed emergency medical condition->Emergency Medical Condition (MA) Reason for Exam: fall, head/neck pain. Acuity: Acute Type of Exam: Initial Mechanism of Injury: fall, head/neck pain. Relevant Medical/Surgical History: none FINDINGS: BONES/ALIGNMENT: There is no acute fracture or traumatic malalignment. DEGENERATIVE CHANGES: Multilevel degenerative changes are seen in the cervical spine with multilevel degenerative disc disease and facet arthropathy.  Posterior disc osteophyte complexes at  C5-6 and C6-7 cause mild indentation on the central canal.  No high-grade spinal canal stenosis. SOFT TISSUES: There is no prevertebral soft tissue swelling.  Atherosclerotic vascular calcifications are noted.  Visualized lung fields show scarring and calcification in the right lung apex and mild scarring in the left lung apex.     No acute abnormality of the cervical spine.     CT THORACIC SPINE WO CONTRAST    Result Date: 11/15/2019  EXAMINATION: CT OF THE THORACIC SPINE WITHOUT CONTRAST  11/15/2019 1:43 am: TECHNIQUE: CT of the thoracic spine was performed without the administration of intravenous contrast. Multiplanar reformatted images are provided for review. Dose modulation, iterative reconstruction, and/or weight based adjustment of the mA/kV was utilized to reduce the radiation dose to as low as reasonably achievable. COMPARISON: None HISTORY: ORDERING SYSTEM PROVIDED HISTORY: upper back pain after fall TECHNOLOGIST PROVIDED HISTORY: CT  T-Spine w/o Contrast Reason for exam:->upper back pain after fall Reason for Exam: upper  back pain after fall Acuity: Acute Type of Exam: Initial FINDINGS: BONES/ALIGNMENT: There is no traumatic malalignment.  Mild superior endplate compression fractures of T2 and T3 are noted.  These are of indeterminate age. DEGENERATIVE CHANGES: No gross spinal canal stenosis or bony neural foraminal narrowing of the thoracic spine. SOFT TISSUES: No paraspinal mass is seen.     Mild super endplate compression deformities of T2 and T3 are noted of indeterminate age.  This can be further evaluated with MRI.     CT ABDOMEN PELVIS W IV CONTRAST Additional Contrast? None    Result Date: 11/15/2019  EXAMINATION: CT OF THE ABDOMEN AND PELVIS WITH CONTRAST, 11/15/2019 1:42 am TECHNIQUE: CT of the abdomen and pelvis was performed with the administration of intravenous contrast. Multiplanar reformatted images are provided for review. Dose modulation, iterative reconstruction, and/or weight based adjustment of the mA/kV was utilized to reduce the radiation dose to as low as reasonably achievable. COMPARISON: None HISTORY: ORDERING SYSTEM PROVIDED HISTORY:  Nasuea vomiting, malaise TECHNOLOGIST PROVIDED HISTORY: Additional Contrast?  None Reason for Exam:  Nausea vomiting, malaise Decision Support Exception - unselect if not a suspected or confirmed emergency medical condition->Emergency Medical Condition (MA) Reason for Exam:  Nausea vomiting, malaise Acuity:  Acute Type of Exam:  Initial FINDINGS: Lower Chest: Chronic interstitial changes are seen at the lung bases bilaterally.  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  Trace pericardial effusion is noted. Organs:  Cholelithiasis is present.  The gallbladder is contracted. Periportal edema is noted.  The spleen and adrenal glands are unremarkable. Chronic calcifications are seen throughout the pancreas, likely related to sequela of chronic pancreatitis.  The right kidney is severely atrophic with coarse calcifications.  The left kidney is  unremarkable. GI/Bowel: There is no evidence of bowel obstruction. Pelvis: The bladder is unremarkable.  There is no free fluid. Peritoneum/Retroperitoneum: There is no free air or hemorrhage.  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  The graft is patent.  Severe calcifications are seen involving the origins of the celiac artery, SMA and the bilateral renal arteries. Bones/Soft Tissues: No destructive osseous lesions are identified.     1. Mild periportal edema.  This is a nonspecific finding but could be related to acute hepatitis. 2. Cholelithiasis. 3. Sequela of chronic pancreatitis. 4. 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 Chronic interstitial changes at the lung bases.     XR CHEST PORTABLE    Result Date: 11/15/2019  EXAMINATION: ONE XRAY VIEW OF  THE CHEST 11/14/2019 10:51 pm COMPARISON: None. HISTORY: ORDERING SYSTEM PROVIDED HISTORY: fall TECHNOLOGIST PROVIDED HISTORY: Reason for exam:->fall Reason for Exam: fall Acuity: Acute Type of Exam: Initial FINDINGS: The cardiomediastinal silhouette is within normal limits.  There is evidence of COPD.  Skin folds are noted along the lateral aspects of the lungs.  There is no evidence of a pneumothorax or large pleural effusion.  Chronic interstitial changes are present.  Superimposed infiltrates at the lung bases cannot be entirely excluded.  There is no definite displaced fracture. Calcified pleural plaque is noted on the right.     1. No definite acute traumatic injury. 2. COPD.  Increased opacities are seen at the lung bases which could be related to chronic interstitial changes, however superimposed infiltrates cannot be entirely excluded.     CT TIBIA FIBULA LEFT W WO CONTRAST    Result Date: 11/15/2019  EXAMINATION: CT OF THE LEFT TIBIA AND FIBULA WITH AND WITHOUT CONTRAST 11/15/2019 5:50 pm TECHNIQUE: CT of the left tibia and fibula was performed with and without the administration of intravenous contrast.  Multiplanar  reformatted images are provided for review. Dose modulation, iterative reconstruction, and/or weight based adjustment of the mA/kV was utilized to reduce the radiation dose to as low as reasonably achievable. COMPARISON: Left tibia/fibula radiographs 11/15/2019. HISTORY ORDERING SYSTEM PROVIDED HISTORY: further evaluate for possible chondroid matrix TECHNOLOGIST PROVIDED HISTORY: Reason for exam:->further evaluate for possible chondroid matrix Reason for Exam: further evaluate for possible chondroid matrix Acuity: Acute Type of Exam: Initial Additional signs and symptoms: no Relevant Medical/Surgical History: 75 ML ISOVUE 370 USED. FINDINGS: Bones: There is a sclerotic lesion in the proximal tibial metaphysis extending to the posterior articular surface of the medial tibial plateau with curvilinear margins likely representing a bone infarct.  Surgical cement is seen in the posterior calcaneus.  No fracture or dislocation.  No other abnormal marrow space-occupying lesion. Soft Tissue: Atherosclerotic vascular calcifications are seen.  The visualized tendons are grossly intact.  No abnormal soft tissue mass or fluid collection. Joint: No knee effusion or popliteal cyst.  No significant degenerative changes of the knee.  The tibiotalar and subtalar joints are normal in appearance.  The talonavicular and calcaneocuboid joints are unremarkable. Mild midfoot degenerative changes.  Normal midfoot alignment is maintained. The metatarsophalangeal joints are intact.     1. 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 2. Surgical cement in the posterior calcaneus compatible with curettage and cement filling.     VL DUP LOWER EXTREMITY VENOUS BILATERAL    Result Date: 11/15/2019  EXAMINATION: DUPLEX VENOUS ULTRASOUND OF THE BILATERAL LOWER EXTREMITIES11/03/2019 8:58 am TECHNIQUE: Duplex ultrasound using B-mode/gray scaled imaging, Doppler spectral analysis and color flow Doppler was obtained of the deep  venous structures of the lower bilateral extremities. COMPARISON: None. HISTORY: ORDERING SYSTEM PROVIDED HISTORY: h/o dvt and off coumadin TECHNOLOGIST PROVIDED HISTORY: Reason for exam:->h/o dvt and off coumadin Acuity: Unknown Type of Exam: Unknown FINDINGS: Ultrasound technologist notes the exam was difficult secondary to arterial plaque shadowing and small veins. The right and left lower extremity veins were interrogated, with the deep venous structures within both the right and left lower extremity patent, unless mentioned specifically below; Right lower extremity: The right distal femoral vein is noncompressible. Partially compressible right popliteal vein.  Noncompressible right peroneal vein.  The greater saphenous vein is patent. Left lower extremity: Noncompressible common femoral vein.  Proximal femoral vein is partially compressible with no Doppler signal seen on color.  Mid femoral vein is noncompressible with partial flow.  Distal femoral vein is noncompressible with no flow.  Greater saphenous vein was not visualized. Peroneal vein not visualized.  Posterior tibial vein partially compressible     Acute DVT noted within the distal femoral vein, popliteal vein, and right peroneal vein. Acute DVT noted within the left common femoral vein, femoral vein and posterior tibial vein, with the peroneal vein not visualized. These findings were discussed with Leroy Sea, RN, at 11:44 a.m. on 11/15/2019.        Physical Exam:  Vitals: BP (!) 167/65    Pulse 63    Temp 98.3 ??F (36.8 ??C) (Oral)    Resp 15    Ht 5\' 11"  (1.803 m)    Wt 170 lb (77.1 kg)    SpO2 97%    BMI 23.71 kg/m??   24 hour intake/output:No intake or output data in the 24 hours ending 11/17/19 0752  Last 3 weights:  Wt Readings from Last 3 Encounters:   11/15/19 170 lb (77.1 kg)       General appearance - alert, well appearing, and in no distress  HEENT: Normocephalic, Atraumatic, Conjuctiva pink, PERRL, Oral mucosa normal, Lips, teeth and gums  normal, Trachea midline, Thyroid normal and No noted lymphadenopathy  Chest - clear to auscultation, no wheezes, rales or rhonchi, symmetric air entry  Cardiovascular - normal rate, regular rhythm, normal S1, S2, no murmurs, rubs, clicks or gallops  Abdomen - soft, nontender, nondistended, no masses or organomegaly   Neurological - Alert and oriented, Normal speech, No focal findings or movement disorder noted and Motor and sensory grossly normal bilaterally  Integumentary - Skin color, texture, turgor normal. No Rashes or lesions  Musculoskeletal -Full ROM times 4 extremities, No clubbing or cyanosis and No peripheral edema      DVT prophylaxis: []  Lovenox                                 []  SCDs                                 []  SQ Heparin                                 []  Encourage ambulation           [x]  Already on Anticoagulation                 ASSESSMENT / PLAN :    Principal Problem:    Acute deep vein thrombosis (DVT) of both lower extremities (HCC)  Change Lovenox to Eliquis and discharge when supplied to him    Active Problems:    Fall at home, initial encounter  Stable and will be discharged home with PT/OT      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 *     To be discharged when Eliquis is procured for the patient.    Electronically signed by 13/03/21, MD on 11/17/2019 at 7:52 AM    Rounding Hospitalist

## 2019-11-16 NOTE — Progress Notes (Signed)
Physical Therapy  Pt is on brief hold until cleared by orthopedic surgery and medicated for blood clots. Will evaluate pt when medically appropriate.

## 2019-11-16 NOTE — ED Notes (Addendum)
1002 perfect serve message sent to Dr Gareth Morgan on in pt consult from hospitalist     Gerald Hill  11/16/19 1003  1006 Dr Gareth Morgan acknowledged perfect serve message. Added to treatment team.     Gerald Hill  11/16/19 1030

## 2019-11-17 LAB — EKG 12-LEAD
Atrial Rate: 68 {beats}/min
Diagnosis: NORMAL
P Axis: -23 degrees
P-R Interval: 154 ms
Q-T Interval: 458 ms
QRS Duration: 90 ms
QTc Calculation (Bazett): 487 ms
R Axis: -9 degrees
T Axis: -16 degrees
Ventricular Rate: 68 {beats}/min

## 2019-11-17 LAB — CBC WITH AUTO DIFFERENTIAL
Basophils %: 0.4 % (ref 0–1)
Basophils Absolute: 0 10*3/uL
Eosinophils %: 6.3 % — ABNORMAL HIGH (ref 0–3)
Eosinophils Absolute: 0.3 10*3/uL
Hematocrit: 32.4 % — ABNORMAL LOW (ref 42–52)
Hemoglobin: 10.7 GM/DL — ABNORMAL LOW (ref 13.5–18.0)
Immature Neutrophil %: 0 % (ref 0–0.43)
Lymphocytes %: 21 % — ABNORMAL LOW (ref 24–44)
Lymphocytes Absolute: 1 10*3/uL
MCH: 29.7 PG (ref 27–31)
MCHC: 33 % (ref 32.0–36.0)
MCV: 90 FL (ref 78–100)
MPV: 9.6 FL (ref 7.5–11.1)
Monocytes %: 11.1 % — ABNORMAL HIGH (ref 0–4)
Monocytes Absolute: 0.5 10*3/uL
Nucleated RBC %: 0 %
Platelets: 190 10*3/uL (ref 140–440)
RBC: 3.6 10*6/uL — ABNORMAL LOW (ref 4.6–6.2)
RDW: 14.6 % (ref 11.7–14.9)
Segs Absolute: 2.9 10*3/uL
Segs Relative: 61.2 % (ref 36–66)
Total Immature Neutrophil: 0 10*3/uL
Total Nucleated RBC: 0 10*3/uL
WBC: 4.8 10*3/uL (ref 4.0–10.5)

## 2019-11-17 LAB — BASIC METABOLIC PANEL W/ REFLEX TO MG FOR LOW K
Anion Gap: 8 (ref 4–16)
BUN: 10 MG/DL (ref 6–23)
CO2: 31 MMOL/L (ref 21–32)
Calcium: 8.6 MG/DL (ref 8.3–10.6)
Chloride: 104 mMol/L (ref 99–110)
Creatinine: 1 MG/DL (ref 0.9–1.3)
GFR African American: >60 mL/min/{1.73_m2} (ref >60)
GFR Non-African American: >60 mL/min/{1.73_m2} (ref >60)
Glucose: 84 MG/DL (ref 70–99)
Potassium: 3.7 MMOL/L (ref 3.5–5.1)
Sodium: 143 MMOL/L (ref 135–145)

## 2019-11-17 LAB — PROTIME/INR & PTT
INR: 1.42 INDEX
Protime: 18.4 SECONDS — ABNORMAL HIGH (ref 11.7–14.5)
aPTT: 33.7 SECONDS (ref 25.1–37.1)

## 2019-11-17 MED FILL — TAMSULOSIN HCL 0.4 MG PO CAPS: 0.4 mg | ORAL | Qty: 1

## 2019-11-17 MED FILL — AMLODIPINE BESYLATE 5 MG PO TABS: 5 mg | ORAL | Qty: 1

## 2019-11-17 MED FILL — MIRTAZAPINE 15 MG PO TABS: 15 mg | ORAL | Qty: 1

## 2019-11-17 MED FILL — ZOLPIDEM TARTRATE 5 MG PO TABS: 5 mg | ORAL | Qty: 2

## 2019-11-17 MED FILL — CREON 6000-19000 UNITS PO CPEP: 6000-19000 [IU] | ORAL | Qty: 6

## 2019-11-17 MED FILL — LISINOPRIL 10 MG PO TABS: 10 mg | ORAL | Qty: 1

## 2019-11-17 MED FILL — ELIQUIS 5 MG PO TABS: 5 mg | ORAL | Qty: 2

## 2019-11-17 MED FILL — OXYCODONE-ACETAMINOPHEN 5-325 MG PO TABS: 5-325 mg | ORAL | Qty: 1

## 2019-11-17 NOTE — Discharge Instructions (Signed)
[facility]   [address], [place] [address]   Phone: [phone]   Office Hours: Monday - Friday, 8:30 a.m. - 4:30 p.m.  [email or web address]  In-home Services  Sometimes having support in the home once a week or once a day makes the difference for families debating whether or not their loved one needs to give up their independent lifestyle. [place] residents, who are age 73 years or better and have health concerns that impact daily life, may qualify for the following in-home services:  Homemakers are available to help individuals who have difficulty completing routine housekeeping chores. Homemakers may provide light housekeeping, run essential errands, do laundry, wash dishes, or prepare a meal.  Personal Care Aides provide basic hygiene and personal care such as bathing, hair care, oral hygiene and dressing.  Respite Care is available to give family caregivers of elderly loved ones much needed time away to relax or to take care of other responsibilities. Respite care may include assistance with walking and eating, preparing a simple meal, assistance to the bathroom, and companionship.  For more information about our services and eligibility contact our In-home Services Director, Wanda Atkins at [phone].  Protection Services  Gatekeeper - Elder Abuse, neglect and financial exploitation are very real issues in our community. Our staff is trained to identify and refer at-risk older adults who are in danger of neglect, abuse and financial exploitation. Once referred, our staff makes in-home visits to establish contact, make assessments, provide resources and make referrals for services. If you are concerned about a loved one, a neighbor or a friend, call Monica Spencer at [phone] to arrange a safety check.  Guardianship - Trained volunteers work as court appointed guardians and serve as personal advocates for individuals age 73 and better, who have no  available or appropriate family or friends to do so. Call Monica Spencer for more information at [phone].  Financial Management - Trained volunteers and staff offer confidential assistance with check writing, checkbook balancing and budgeting to those who are unable to manage their finances independently. Call Monica Spencer for more information at [phone].  Companion and Prevention programs at [facility] are designed to preserve personal dignity through socialization, friendship and peer support. With advancing age the ability to cope and problem solve, and positive feelings of self-worth can make the difference in life adjustment and overall life satisfaction. Assistance with life skills and socialization are offered to help isolated adults get out on a regular basis. Fun activities are offered throughout the community on a regular schedule. Referrals are welcome by calling Laura Fulton at [phone].  Emergency Assistance  Assistance is available for home modifications, utility bill shut-offs, home energy and telephone discounts. Contact Joyce Robinson at [phone] to learn more.  [facility] Lawn[facility] Service  The [facility] Lawn[employer] Service is designed to assist seniors who have difficulty with mowing and maintaining their lawns from May into September. [facility] facilitates grass mowing and trimming with a local lawncare contractor. Because the demand is so high, there is a waiting list for this service. Clients are asked to donate a portion of the cost of the service according to their financial ability; however no one will be denied service because of inability to pay. Contact John Paulsen at [phone] to learn more.    Dining and Meals  "By feeding hundreds of senior citizens in our community every day, we are helping people get one of life's most basic needs: a   daily hot, nutritious meal." To learn more about [facility] Meals, call Sharon Torres at [phone].   Meals on Wheels Home Delivery  Proper  nutrition is critical to maintaining good health. Noontime and evening meals, hot, chilled or frozen, are available Monday through Friday for a donation or a small fee. Daily interaction with our meal delivery team combined with proper nutrition provides many benefits to those who, for many reasons, cannot prepare their own food. Contact us for more information at [phone].  [facility] Dining Rooms  Proper nutrition is provided in a group setting at community dining rooms at noontime for those ages 60 and better. A hot meal, shared with friends and nutrition education is provided. Reservations are required 24 hours in advance.   Program Sites - Meals served at all locations, Monday - Friday  [facility] - [address], [place] [address]  [facility] - [address], [place] [address]  [facility] - [address], [place] [address]  [facility] - [address], [place] [address]  [facility] - [address], [place] [address]  [facility] - [address], [place] [address]  Transportation  Door-to-door transportation is provided to medical appointments, nutritional sites, and essential errands for [place] residents, age 73+. Donations are appreciated.  Drivers are employees and are trained, tested, and supervised to assure safe travel. Vehicles can accommodate ambulatory individuals as well as those traveling in wheelchairs.  To insure availability, please make your transportation request at least 2 weeks in advance for appointments in [place], and 4 weeks in advance for out of town appointments. Every effort will be made to accommodate emergency requests, but availability of a driver and a vehicle cannot be guaranteed without sufficient notice. Please call Lisa McDonough at [phone] for an appointment or more information.  Health Outreach  Trained staff provides education and access to resources for  those with health concerns, or other critical needs. Medical professionals and social service personnel are available to conduct confidential health and wellness consultations and screenings.  The programs also serve as a clearinghouse, providing recommendations to clients regarding health intervention, recovery or ongoing health management. Disease-specific educational sessions are offered in response to population needs. "Key to creating an active, involved and independent life comes through connecting with others. The people of [facility] work hard every day to make the aging process a positive one for thousands of members."     [place] [facility]- Adult Protective Services  To report concerns of abuse/neglect, inability to care for self  [address] [place] [address]  [phone]        Alzheimer's Association  Support groups, information and referrals  [address]  [place] [address]  1-800-272-3900    Information & Referral for [place]  Referral line to connect with available county resources/services   [place] 211/[phone]  [place] [phone]  The Area Agency on Aging, [facility]  [address], [address]  [place] [address]    Local: 937-223-HELP  Toll Free: [phone]  Fax: [phone]     ASSISTED LIVING   Assisted living combines a home-like setting with personal support services to provide more intensive care than is available through home care services. Assisted living facilities provide older adults with an alternative to nursing facility care that is both less expensive and less restrictive.  Assisted   living residences vary considerably, but most provide meals, housekeeping, laundry, transportation, and social activities. They also offer personal care, such as assistance with eating, bathing, grooming and personal hygiene. Some nursing care is also provided, including medication administration and dressing changes.  Costs for assisted living  generally range from $2,000 to $4,000 per month and vary depending on the size of living area an older adult chooses, area of the state and the amount of care needed.  [place]'s Assisted Living Waiver Program pays the costs of care in an assisted living facility for certain people with Medicaid, allowing the consumer to use his or her resources to cover "room and board" expenses.  Individuals who meet certain service and care needs and meet established financial criteria may be eligible for [place]'s Assisted Living Waiver Program.  To find out if assisted living is a good option for you, contact your Area Agency on Aging and request a free assessment.            PASSPORT  Most older [place]ans prefer to live independently in their own homes, in their communities, surrounded by family and friends, for as long as they can. But, many need some help doing so. [place]'s PASSPORT Medicaid waiver program helps Medicaid-eligible older [place]ans get the long-term services and supports they need to stay in their homes or other community settings, rather than enter nursing homes, when appropriate.  PASSPORT is a two-pronged program. The first part is a pre-admission screening during which interested consumers are screened by telephone to determine preliminary Medicaid eligibility and care needs. They are also provided information about the variety of long-term care options available.  The second part of PASSPORT is home care. Once a consumer is determined eligible a case manager works with him or her to develop a package of in-home services to be provided by local service providers. The case manager then monitors the care for quality and changes the care plan as necessary.   Eligible PASSPORT participants are:  - Age 73 or older;  - Financially eligible for Medicaid institutional care (for 2020, this means typically earning no more than $2,349 per month for one person and having no more than $2,000 in countable assets, though individuals  above this limit may be eligible based on the extent of their medical and in-home needs);  - Frail enough to require a nursing home level of care; and  - Able to remain safely at home with the consent of their physician.  Some costs incurred by the state for PASSPORT care may be subject to estate recovery. Estate Recovery is required by the federal Omnibus Budget Reconciliation Act of 1993, and by Section 5111.11 of the [place] Revised Code. All Medicaid services provided to persons age 55 or older are subject to recovery, including physician visits, outpatient visits and home- and community-based waiver services like PASSPORT and all medically related Medicaid services. For more information about estate recovery, contact your county Office of Job and Family Services.    Office of the State Long-Term Care Ombudsman  [place]'s Office of the State Long-term Care Ombudsman advocates for people receiving home care, assisted living and nursing home care. Paid and volunteer staff work to resolve complaints about services, help people select a provider and offer information about benefits and consumer rights.  Ombudsmen do not regulate nursing homes and home health agencies, but do work with providers, residents, their families and other representatives to resolve problems and concerns.  In addition, ombudsmen:  - Advocate for person-centered   approaches by providers to meet the needs and honor the preferences of residents;  - Link residents with services or agencies;  - Offer resources for selecting long-term care providers; and  - Provide information and assistance with benefits and insurance.  For assistance, contact your regional long-term care ombudsman program, or call 1-800-282-1206.  [facility] ([place])  [address] ([facility] [place] [address]  [phone]    Adult Day Services and Respite Care  As the nation's leader in providing person-centered adult day services,  [facility] understands the need to care for an aging spouse or family member. We recognize the challenges of caring for a loved one with dementia and your need for support and time for yourself. As an [facility] provider, we partner with you and your loved one on this journey, celebrate every progress made, and are there to offer support and assistance when physical or mental abilities become diminished.  Our programs feature:  Enriching activities (games, crafts, physical fitness, and more)  Nutritious meals  Registered Nurse on premises  Certified dementia practitioners  Transportation to and from program

## 2019-11-17 NOTE — Progress Notes (Signed)
[facility] Liaison spoke w/pt & is aware of discharge & will initiate HHC.

## 2019-11-17 NOTE — Discharge Summary (Addendum)
HOSPITALIST DISCHARGE SUMMARY     Patient ID: Gerald Hill [id number]                 [id number]      PCP:  No primary care provider on file.    Admit date: 11/14/2019   Discharge date: 11/17/2019      Admitting Physician: Fulton Mole, MD  Attending Physician(s): Storm Frisk, MD, MD;    Discharge Physician: Storm Frisk, MD, MD.  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 *    Discharged Condition: stable  Disposition: home    Procedures: None  Complications: Not applicable    Brief History: Gerald Hill is a 74 y.o. male admitted for fall, and inability to take care of himself.  Patient states that he had diarrhea recently, which now has resolved, and was found to have feces all over the home as per paramedics.  Family not within home, patient fell, and called his life alert system, paramedics came through the window, and brought him to the ED.  Patient states that he was already in the bed with the paramedics came, however it took about 30 minutes to get up from the floor.  Patient denies any focal motor deficits.  He states his fall was not associated dizziness, lightheadedness, or loss of consciousness.  Patient is not the best historian.  Albeit he has no complaints of CP, SOB, dizziness, N/V/C/D, abdominal pain, dysuria, joint pains, rash/boils, or fevers.   ??  Hospital Course: Admitted as above initially with a fall, extensive work-up did not show any fracture, however CT of the lower extremities did show a left tibia infarction, seen by orthopedic surgery consults with recommendation for follow-up nothing to be done.  Patient  had apparently stopped taking his Coumadin because of frequent blood checks, Doppler did show multiple DVTs of the lower extremities that were acute, his DVT prophylactics was converted to treatment doses of Lovenox, was maintained in-house overnight, and is going to be discharged home with p.o. Eliquis.  Risks, benefits and alternatives were discussed with patient at length and he does agree to this plan.  He will therefore be discharged home with some pain medication and will be following up with his primary care physician as well as the oncologist upon discharge.  Patient had been threatening to leave as quickly as possible while in the emergency room.    Prognosis: Fair to good    Physical Examination: General appearance: alert, appears stated age and cooperative  Head: Normocephalic, without obvious abnormality, atraumatic  Eyes: conjunctivae/corneas clear. PERRL, EOM's intact. Fundi benign.  Ears: normal TM's and external ear canals both ears  Nose: Nares normal. Septum midline. Mucosa normal. No drainage or sinus tenderness.  Throat: lips, mucosa, and tongue normal; teeth and gums normal  Neck: no adenopathy, no carotid bruit, no JVD, supple, symmetrical, trachea midline and thyroid not enlarged, symmetric, no tenderness/mass/nodules  Back: symmetric, no curvature. ROM normal. No CVA tenderness.  Lungs: clear to auscultation bilaterally  Chest wall: no tenderness  Heart: regular rate and rhythm, S1, S2 normal, no murmur, click, rub or gallop  Abdomen:  soft, non-tender; bowel sounds normal; no masses,  no organomegaly  Extremities: extremities normal, atraumatic, no cyanosis or edema  Pulses: 2+ and symmetric  Skin: Skin color, texture, turgor normal. No rashes or lesions  Neurologic: Grossly normal    Significant Diagnostic Studies: Bilateral duplex scan of the lower extremities:   Impression: ??   ?? Acute DVT noted within the distal femoral vein, popliteal vein, and right   peroneal vein.     Acute DVT noted  within the left common femoral vein, femoral vein and   posterior tibial vein, with the peroneal vein not visualized.        Pending Investigation/labs: None    Recent Labs:  CBC with Differential:    Lab Results   Component Value Date    WBC 4.8 11/17/2019    RBC 3.60 11/17/2019    HGB 10.7 11/17/2019    HCT 32.4 11/17/2019    PLT 190 11/17/2019    MCV 90.0 11/17/2019    MCH 29.7 11/17/2019    MCHC 33.0 11/17/2019    RDW 14.6 11/17/2019    SEGSPCT 61.2 11/17/2019    LYMPHOPCT 21.0 11/17/2019    MONOPCT 11.1 11/17/2019    BASOPCT 0.4 11/17/2019    MONOSABS 0.5 11/17/2019    LYMPHSABS 1.0 11/17/2019    EOSABS 0.3 11/17/2019    BASOSABS 0.0 11/17/2019    DIFFTYPE AUTOMATED DIFFERENTIAL 11/17/2019     CMP:    Lab Results   Component Value Date    NA 143 11/17/2019    K 3.7 11/17/2019    CL 104 11/17/2019    CO2 31 11/17/2019    BUN 10 11/17/2019    CREATININE 1.0 11/17/2019    GFRAA >60 11/17/2019    LABGLOM >60 11/17/2019    GLUCOSE 84 11/17/2019    PROT 6.6 11/14/2019    LABALBU 3.8 11/14/2019    CALCIUM 8.6 11/17/2019    BILITOT 0.6 11/14/2019    ALKPHOS 54 11/14/2019    AST 21 11/14/2019    ALT 14 11/14/2019     Magnesium:  No results found for: MG  Phosphorus:  No results found for: PHOS    Patient Instructions  Medications:   [place]   Home Medication Instructions [phone]    Printed on:11/17/19 0758   Medication Information                      amLODIPine (NORVASC) 5 MG tablet  amlodipine 5 mg tablet             apixaban (ELIQUIS) 5 MG TABS tablet  Take 2 tablets by mouth 2 times daily for 14 doses             apixaban (ELIQUIS) 5 MG TABS tablet  Take 1 tablet by mouth 2 times daily             gabapentin (NEURONTIN) 600 MG tablet               hydrOXYzine (ATARAX) 25 MG tablet  TAKE 1 TABLET BY MOUTH THREE TIMES DAILY AS NEEDED             lipase-protease-amylase (CREON) 36000-114000 units CPEP delayed release capsule  Creon 36,000 unit-114,000 unit-180,000 unit capsule,delayed release   TAKE 2  CAPSULES BY MOUTH WITH MEALS AND 1 WITH SNACKS             lisinopril (PRINIVIL;ZESTRIL) 10 MG tablet  lisinopril 10 mg tablet   TAKE  ONE TABLET BY MOUTH DAILY             mirtazapine (REMERON) 15 MG tablet  Take 1 tablet by mouth nightly             oxyCODONE-acetaminophen (PERCOCET) 5-325 MG per tablet  Take 1 tablet by mouth every 6 hours as needed for Pain for up to 7 days.             QUEtiapine (SEROQUEL) 200 MG tablet  quetiapine 200 mg tablet             tamsulosin (FLOMAX) 0.4 MG capsule  tamsulosin 0.4 mg capsule             zolpidem (AMBIEN) 10 MG tablet  Take by mouth nightly.                  Activity: activity as tolerated  Diet: regular diet  Wound Care: none needed  Follow-up with: PCP and oncology consult  Services: Home health care      Electronically Signed by: Storm Frisk, MD, MD.    Time spent on discharge/dictation: 50 minutes

## 2019-11-17 NOTE — Care Coordination-Inpatient (Addendum)
Reviewed chart and spoke with pt about discharge needs/plans. Pt lives alone, PTA he was independent with ADL's and Kathlene November his son in-law assists hime with Transportation. He has an emergency response button, RW, cane and hover round.  He has a PCP and insurance that covers medications.  When asked about home situation , he admits to feces being all over in 1 bathroom d/t being so sick, he could not clean it up.  He states Kathlene November will assist with cleaning and cooking.  We discussed home care and he declined but did say I could call his sister or mike to get him a ride home.  I called Kathlene November , who states he has a bad back and will not be staying with pt, He will check on him once a day. When asked about home Kathlene November states there is feces all over the home and Mike's stomach will not allow him to clean it up for pt.  Kathlene November is not able to give pt ride today d/t having his own doctor appointments.   Spoke with Alona Bene and she gave me pt's other sister's number to call [phone].  VM left for linda    1000 Spoke with pt again, Explained Kathlene November will not be able to stay with him.  We discussed HC again and he is agreeable and not HC preference. Discussed [facility] and agreeable. PS to Dr Alison Stalling for [facility] orders and PS to shea C with referral to [facility].       1055 Called APS referral to Amy at [facility].  Also Angelique Blonder from APS showed up at [facility] desk from Referral made when pt came in.  Updated on pt's discharge plan. She is going into room for assessment.        1020 Placed Senior resources into pt's AVS.

## 2021-02-12 DEATH — deceased
